# Patient Record
Sex: Female | Born: 1935 | Race: White | Hispanic: No | Smoking: Never smoker
Health system: Southern US, Community
[De-identification: ages and names within clinical notes are randomized; demographics above are authoritative.]

## PROBLEM LIST (undated history)

## (undated) DIAGNOSIS — I10 Essential (primary) hypertension: Secondary | ICD-10-CM

## (undated) DIAGNOSIS — I2510 Atherosclerotic heart disease of native coronary artery without angina pectoris: Secondary | ICD-10-CM

## (undated) DIAGNOSIS — D649 Anemia, unspecified: Secondary | ICD-10-CM

## (undated) DIAGNOSIS — Q796 Ehlers-Danlos syndrome, unspecified: Secondary | ICD-10-CM

## (undated) DIAGNOSIS — I509 Heart failure, unspecified: Secondary | ICD-10-CM

## (undated) DIAGNOSIS — Q359 Cleft palate, unspecified: Secondary | ICD-10-CM

## (undated) DIAGNOSIS — K219 Gastro-esophageal reflux disease without esophagitis: Secondary | ICD-10-CM

## (undated) DIAGNOSIS — I350 Nonrheumatic aortic (valve) stenosis: Secondary | ICD-10-CM

## (undated) HISTORY — DX: Nonrheumatic aortic (valve) stenosis: I35.0

## (undated) HISTORY — PX: CATARACT EXTRACTION: SUR2

## (undated) HISTORY — PX: CORONARY ARTERY BYPASS GRAFT: SHX141

## (undated) HISTORY — PX: TONSILLECTOMY: SUR1361

## (undated) HISTORY — PX: CARDIAC SURGERY: SHX584

## (undated) HISTORY — PX: CYSTOSTOMY: SHX155

## (undated) HISTORY — PX: ABDOMINAL HYSTERECTOMY: SHX81

## (undated) HISTORY — DX: Atherosclerotic heart disease of native coronary artery without angina pectoris: I25.10

## (undated) HISTORY — PX: HERNIA REPAIR: SHX51

---

## 2001-11-18 ENCOUNTER — Other Ambulatory Visit: Admission: RE | Admit: 2001-11-18 | Discharge: 2001-11-18 | Payer: Self-pay | Admitting: Internal Medicine

## 2002-03-16 ENCOUNTER — Observation Stay (HOSPITAL_COMMUNITY): Admission: RE | Admit: 2002-03-16 | Discharge: 2002-03-17 | Payer: Self-pay | Admitting: Internal Medicine

## 2005-07-16 ENCOUNTER — Ambulatory Visit (HOSPITAL_COMMUNITY): Admission: RE | Admit: 2005-07-16 | Discharge: 2005-07-16 | Payer: Self-pay | Admitting: General Surgery

## 2006-05-07 ENCOUNTER — Ambulatory Visit (HOSPITAL_COMMUNITY): Payer: Self-pay | Admitting: Oncology

## 2006-05-07 ENCOUNTER — Encounter (HOSPITAL_COMMUNITY): Admission: RE | Admit: 2006-05-07 | Discharge: 2006-06-06 | Payer: Self-pay | Admitting: Oncology

## 2006-05-07 ENCOUNTER — Encounter: Admission: RE | Admit: 2006-05-07 | Discharge: 2006-05-07 | Payer: Self-pay | Admitting: Oncology

## 2006-06-21 ENCOUNTER — Encounter (HOSPITAL_COMMUNITY): Admission: RE | Admit: 2006-06-21 | Discharge: 2006-07-21 | Payer: Self-pay | Admitting: Oncology

## 2006-06-21 ENCOUNTER — Encounter: Admission: RE | Admit: 2006-06-21 | Discharge: 2006-06-21 | Payer: Self-pay | Admitting: Oncology

## 2006-07-10 ENCOUNTER — Ambulatory Visit: Payer: Self-pay | Admitting: Internal Medicine

## 2006-07-26 ENCOUNTER — Ambulatory Visit (HOSPITAL_COMMUNITY): Admission: RE | Admit: 2006-07-26 | Discharge: 2006-07-26 | Payer: Self-pay | Admitting: Internal Medicine

## 2006-07-26 ENCOUNTER — Ambulatory Visit: Payer: Self-pay | Admitting: Internal Medicine

## 2006-08-20 ENCOUNTER — Ambulatory Visit (HOSPITAL_COMMUNITY): Admission: RE | Admit: 2006-08-20 | Discharge: 2006-08-20 | Payer: Self-pay | Admitting: Otolaryngology

## 2006-08-23 ENCOUNTER — Ambulatory Visit (HOSPITAL_COMMUNITY): Payer: Self-pay | Admitting: Oncology

## 2006-08-27 ENCOUNTER — Encounter: Admission: RE | Admit: 2006-08-27 | Discharge: 2006-08-27 | Payer: Self-pay | Admitting: Oncology

## 2006-08-27 ENCOUNTER — Encounter (HOSPITAL_COMMUNITY): Admission: RE | Admit: 2006-08-27 | Discharge: 2006-09-26 | Payer: Self-pay | Admitting: Oncology

## 2006-11-18 ENCOUNTER — Ambulatory Visit (HOSPITAL_COMMUNITY): Payer: Self-pay | Admitting: Oncology

## 2006-11-18 ENCOUNTER — Encounter (HOSPITAL_COMMUNITY): Admission: RE | Admit: 2006-11-18 | Discharge: 2006-12-18 | Payer: Self-pay | Admitting: Oncology

## 2006-11-18 ENCOUNTER — Encounter: Admission: RE | Admit: 2006-11-18 | Discharge: 2006-11-18 | Payer: Self-pay | Admitting: Oncology

## 2007-05-28 ENCOUNTER — Encounter (HOSPITAL_COMMUNITY): Admission: RE | Admit: 2007-05-28 | Discharge: 2007-06-27 | Payer: Self-pay | Admitting: Oncology

## 2007-05-28 ENCOUNTER — Ambulatory Visit (HOSPITAL_COMMUNITY): Payer: Self-pay | Admitting: Oncology

## 2007-11-24 ENCOUNTER — Ambulatory Visit (HOSPITAL_COMMUNITY): Payer: Self-pay | Admitting: Oncology

## 2007-11-24 ENCOUNTER — Encounter (HOSPITAL_COMMUNITY): Admission: RE | Admit: 2007-11-24 | Discharge: 2007-12-24 | Payer: Self-pay | Admitting: Oncology

## 2008-01-19 ENCOUNTER — Ambulatory Visit (HOSPITAL_COMMUNITY): Payer: Self-pay | Admitting: Oncology

## 2008-01-19 ENCOUNTER — Encounter (HOSPITAL_COMMUNITY): Admission: RE | Admit: 2008-01-19 | Discharge: 2008-02-18 | Payer: Self-pay | Admitting: Oncology

## 2008-05-12 ENCOUNTER — Ambulatory Visit (HOSPITAL_COMMUNITY): Payer: Self-pay | Admitting: Oncology

## 2008-05-12 ENCOUNTER — Encounter (HOSPITAL_COMMUNITY): Admission: RE | Admit: 2008-05-12 | Discharge: 2008-06-11 | Payer: Self-pay | Admitting: Oncology

## 2008-08-16 ENCOUNTER — Ambulatory Visit (HOSPITAL_COMMUNITY): Payer: Self-pay | Admitting: Oncology

## 2008-08-16 ENCOUNTER — Encounter (HOSPITAL_COMMUNITY): Admission: RE | Admit: 2008-08-16 | Discharge: 2008-09-15 | Payer: Self-pay | Admitting: Oncology

## 2008-12-01 ENCOUNTER — Encounter (HOSPITAL_COMMUNITY): Admission: RE | Admit: 2008-12-01 | Discharge: 2008-12-31 | Payer: Self-pay | Admitting: Oncology

## 2008-12-01 ENCOUNTER — Ambulatory Visit (HOSPITAL_COMMUNITY): Payer: Self-pay | Admitting: Oncology

## 2009-02-08 ENCOUNTER — Ambulatory Visit (HOSPITAL_COMMUNITY): Payer: Self-pay | Admitting: Oncology

## 2009-02-08 ENCOUNTER — Encounter (HOSPITAL_COMMUNITY): Admission: RE | Admit: 2009-02-08 | Discharge: 2009-03-10 | Payer: Self-pay | Admitting: Oncology

## 2009-08-03 ENCOUNTER — Ambulatory Visit (HOSPITAL_COMMUNITY): Admission: RE | Admit: 2009-08-03 | Discharge: 2009-08-03 | Payer: Self-pay | Admitting: Family Medicine

## 2009-08-12 ENCOUNTER — Ambulatory Visit (HOSPITAL_COMMUNITY): Payer: Self-pay | Admitting: Oncology

## 2009-08-12 ENCOUNTER — Encounter (HOSPITAL_COMMUNITY): Admission: RE | Admit: 2009-08-12 | Discharge: 2009-09-11 | Payer: Self-pay | Admitting: Oncology

## 2010-02-03 ENCOUNTER — Encounter (HOSPITAL_COMMUNITY): Admission: RE | Admit: 2010-02-03 | Discharge: 2010-03-05 | Payer: Self-pay | Admitting: Oncology

## 2010-02-03 ENCOUNTER — Ambulatory Visit (HOSPITAL_COMMUNITY): Payer: Self-pay | Admitting: Oncology

## 2010-07-31 ENCOUNTER — Ambulatory Visit (HOSPITAL_COMMUNITY): Payer: Self-pay | Admitting: Oncology

## 2010-07-31 ENCOUNTER — Encounter (HOSPITAL_COMMUNITY): Admission: RE | Admit: 2010-07-31 | Discharge: 2010-08-30 | Payer: Self-pay | Admitting: Oncology

## 2010-10-16 ENCOUNTER — Encounter (HOSPITAL_COMMUNITY)
Admission: RE | Admit: 2010-10-16 | Discharge: 2010-11-15 | Payer: Self-pay | Source: Home / Self Care | Admitting: Oncology

## 2010-10-16 ENCOUNTER — Ambulatory Visit (HOSPITAL_COMMUNITY): Payer: Self-pay | Admitting: Oncology

## 2010-12-11 ENCOUNTER — Ambulatory Visit (HOSPITAL_COMMUNITY): Payer: Self-pay | Admitting: Oncology

## 2010-12-11 ENCOUNTER — Encounter (HOSPITAL_COMMUNITY)
Admission: RE | Admit: 2010-12-11 | Discharge: 2011-01-10 | Payer: Self-pay | Source: Home / Self Care | Attending: Oncology | Admitting: Oncology

## 2011-03-12 LAB — CBC
Hemoglobin: 11.6 g/dL — ABNORMAL LOW (ref 12.0–15.0)
RBC: 3.85 MIL/uL — ABNORMAL LOW (ref 3.87–5.11)
WBC: 5.7 10*3/uL (ref 4.0–10.5)

## 2011-03-12 LAB — FERRITIN: Ferritin: 326 ng/mL — ABNORMAL HIGH (ref 10–291)

## 2011-03-13 ENCOUNTER — Other Ambulatory Visit (HOSPITAL_COMMUNITY): Payer: Medicare Other

## 2011-03-14 LAB — CBC
HCT: 35.2 % — ABNORMAL LOW (ref 36.0–46.0)
Hemoglobin: 12 g/dL (ref 12.0–15.0)
WBC: 6.3 10*3/uL (ref 4.0–10.5)

## 2011-03-16 ENCOUNTER — Other Ambulatory Visit (HOSPITAL_COMMUNITY): Payer: Medicare Other

## 2011-03-16 ENCOUNTER — Encounter (HOSPITAL_COMMUNITY): Payer: Medicare Other | Attending: Oncology

## 2011-03-16 DIAGNOSIS — D509 Iron deficiency anemia, unspecified: Secondary | ICD-10-CM | POA: Insufficient documentation

## 2011-03-16 DIAGNOSIS — D649 Anemia, unspecified: Secondary | ICD-10-CM

## 2011-03-16 LAB — COMPREHENSIVE METABOLIC PANEL
ALT: 13 U/L (ref 0–35)
Albumin: 3.8 g/dL (ref 3.5–5.2)
Alkaline Phosphatase: 66 U/L (ref 39–117)
BUN: 28 mg/dL — ABNORMAL HIGH (ref 6–23)
Chloride: 102 mEq/L (ref 96–112)
Glucose, Bld: 107 mg/dL — ABNORMAL HIGH (ref 70–99)
Potassium: 4 mEq/L (ref 3.5–5.1)
Total Bilirubin: 0.5 mg/dL (ref 0.3–1.2)

## 2011-03-16 LAB — CBC
HCT: 34.2 % — ABNORMAL LOW (ref 36.0–46.0)
MCV: 86.7 fL (ref 78.0–100.0)
Platelets: 278 10*3/uL (ref 150–400)
RBC: 3.94 MIL/uL (ref 3.87–5.11)
WBC: 5.5 10*3/uL (ref 4.0–10.5)

## 2011-03-16 LAB — DIFFERENTIAL
Basophils Absolute: 0 10*3/uL (ref 0.0–0.1)
Basophils Relative: 1 % (ref 0–1)
Eosinophils Absolute: 0.2 10*3/uL (ref 0.0–0.7)
Monocytes Absolute: 0.3 10*3/uL (ref 0.1–1.0)
Neutro Abs: 3.8 10*3/uL (ref 1.7–7.7)

## 2011-03-19 ENCOUNTER — Ambulatory Visit (HOSPITAL_COMMUNITY): Payer: Medicare Other | Admitting: Oncology

## 2011-03-19 DIAGNOSIS — D509 Iron deficiency anemia, unspecified: Secondary | ICD-10-CM

## 2011-03-21 LAB — CBC
MCHC: 33.3 g/dL (ref 30.0–36.0)
Platelets: 346 10*3/uL (ref 150–400)
RBC: 4.36 MIL/uL (ref 3.87–5.11)
RDW: 12.9 % (ref 11.5–15.5)

## 2011-03-21 LAB — FERRITIN: Ferritin: 28 ng/mL (ref 10–291)

## 2011-04-07 LAB — FERRITIN: Ferritin: 53 ng/mL (ref 10–291)

## 2011-04-07 LAB — CBC
HCT: 34.9 % — ABNORMAL LOW (ref 36.0–46.0)
Platelets: 243 10*3/uL (ref 150–400)
RDW: 13 % (ref 11.5–15.5)
WBC: 4.3 10*3/uL (ref 4.0–10.5)

## 2011-04-17 LAB — CBC
Hemoglobin: 11.8 g/dL — ABNORMAL LOW (ref 12.0–15.0)
RBC: 3.96 MIL/uL (ref 3.87–5.11)
WBC: 5.7 10*3/uL (ref 4.0–10.5)

## 2011-04-17 LAB — FERRITIN: Ferritin: 160 ng/mL (ref 10–291)

## 2011-05-18 NOTE — Procedures (Signed)
[facility]  Patient:    [name], [name] Visit Number: [id number] MRN: [id number]          Service Type: OBV Location: 3A A336 01 Attending Physician:  Newt Minion Dictated by:   Sinda Du, M.D. Proc. Date: 03/13/02 Admit Date:  03/16/2002 Discharge Date: 03/17/2002                            EKG Interpretations  This EKG is confirmed. Dictated by:   Sinda Du, M.D. Attending Physician:  Newt Minion DD:  04/01/02 TD:  04/02/02 Job: [id number]

## 2011-05-18 NOTE — H&P (Signed)
[facility]  Patient:    [name], [name] Visit Number: [id number] MRN: [id number]          Service Type: Attending:  Newt Minion, M.D. Dictated by:   Newt Minion, M.D. Adm. Date:  03/16/02                           History and Physical  REASON FOR ADMISSION:  This 77 year old white female is admitted to this hospital with a recurrent right inguinal hernia.  HISTORY OF PRESENT ILLNESS:  The patient had a repair of a right inguinal hernia in September of 2000.  Recently she began to complain of pain and discomfort in the right groin with radiation down the right leg.  Exam today in the office did reveal a definite pulsation noted over the area of the external ring and also near the right femoral artery.  However, it was difficult to differentiate whether this was a recurrent right inguinal hernia or recurrent femoral hernia.  She is admitted now for repair.  PAST MEDICAL HISTORY: 1. She had a right partial mastectomy in September of 2000. 2. Left partial mastectomy in 1976. 3. The hernia repair as mentioned. 4. Vaginal hysterectomy and anterior repair in January 1988. 5. Mild hypertension. 6. No serious illnesses.  ALLERGIES:  No history of any food or drug allergy.  CURRENT MEDICATIONS: 1. Altace 10 mg daily. 2. HCTZ 25 mg daily for mild hypertension.  PHYSICAL EXAMINATION:  GENERAL:  Healthy well-developed, well-nourished 77 year old white female in no acute distress.  VITAL SIGNS:  Blood pressure 126/84, pulse 80, respirations 20.  HEENT:  Normal.  NECK:  Supple, thyroid not enlarged, no palpable cervical adenopathy.  CARDIOVASCULAR:  Regular sinus rhythm, no thrills, no murmurs.  RESPIRATORY:  Chest clear to auscultation and percussion.  BREASTS:  Of modest size, nipples are symmetrical, there is a well-healed operative scar in the upper outer quadrant of both breasts.  The tissue was irregular throughout both glands but no  discreet masses are noted. Examination of both axilla is within normal limits.  ABDOMEN:  Soft, has normal peristalsis, no hepatosplenomegaly.  There is no operative scars other than the barely visible incision in the right groin.  PELVIC:  There is a marital introitus.  No pathology of Bartholins and Skenes glands.  Vaginal cuff is well healed.  A recent Pap smear was normal. Examination of both adnexa was normal.  Rectovaginal examination was normal.  ADMITTING DIAGNOSIS:  Recurrent right inguinal/femoral hernia.  DISPOSITION:  The patient is admitted for repair of this hernia.  The surgery, risks and complications have been discussed thoroughly with the patient and she agrees to the surgery.  She has been scheduled for March 16, 2002. Dictated by:   Newt Minion, M.D. Attending:  Newt Minion, M.D. DD:  03/13/02 TD:  03/14/02 Job: [id number]

## 2011-05-18 NOTE — H&P (Signed)
NAME:  [name], [name]             ACCOUNT NO.:  [id number]   MEDICAL RECORD NO.:  [id number]           PATIENT TYPE:  AMB   LOCATION:                                FACILITY:  [facility]   PHYSICIAN:  Jamesetta So, M.D.  DATE OF BIRTH:  03/23/1922   DATE OF ADMISSION:  DATE OF DISCHARGE:  LH                                HISTORY & PHYSICAL   CHIEF COMPLAINT:  hematochezia   HISTORY OF PRESENT ILLNESS:  The patient is an 77 year old white female who  is referred for endoscopic evaluation. She needs a colonoscopy for  hematochezia. She does have a history of diverticulosis. She last had a  colonoscopy in 2000 by gastroenterology. She was noted to have blood on her  stool on a recent examination. No abdominal pain, weight loss, nausea,  vomiting, diarrhea, or melena have been noted.   PAST MEDICAL HISTORY:  Hypertension.   PAST SURGICAL HISTORY:  1.  Hysterectomy.  2.  Herniorrhaphy.  3.  Breast biopsy.   CURRENT MEDICATIONS:  Altace, hydrochlorothiazide.   ALLERGIES:  No known drug allergies.   REVIEW OF SYSTEMS:  Noncontributory.   PHYSICAL EXAMINATION:  GENERAL:  The patient is a well-developed, well-  nourished, white female in no acute distress. She is afebrile and vital  signs are stable.  LUNGS:  Clear to auscultation with equal breath sounds bilaterally.  HEART:  Reveals regular rate and rhythm without S3, S4, or murmurs.  ABDOMEN:  Soft, nontender, nondistended. No hepatosplenomegaly or masses are  noted.  RECTAL:  Deferred to the procedure.   IMPRESSION:  Hematochezia.   PLAN:  The patient is scheduled for colonoscopy on June 26, 2005. The risks  and benefits of the procedure including bleeding and perforation were fully  explained to the patient who gave informed consent.       MAJ/MEDQ  D:  05/29/2005  T:  05/29/2005  Job:  [id number]   cc:   [facility]  Fax: Hornick Marnette Burgess, M.D.  [address]  [place]  [place] [address]  Fax:  [phone]

## 2011-05-18 NOTE — Op Note (Signed)
NAME:  [name], [name]             ACCOUNT NO.:  [id number]   MEDICAL RECORD NO.:  [id number]          PATIENT TYPE:  AMB   LOCATION:  DAY                           FACILITY:  [facility]   PHYSICIAN:  Hildred Laser, M.D.    DATE OF BIRTH:  [birth year]   DATE OF PROCEDURE:  07/26/2006  DATE OF DISCHARGE:                                 OPERATIVE REPORT   PROCEDURE:  Esophagogastroduodenoscopy.   INDICATIONS:  [name] is an 77 year old Caucasian female who was  recently found to have iron-deficiency anemia by Dr. Tressie Stalker.  Her  hemoglobin dropped to 8.5.  She has been on Repliva and her hemoglobin 1  month ago was 10.5.  She had Hemoccults and 1 out of 6 was positive.  She is  on low-dose ASA.  Her last colonoscopy was a year ago.  We therefore decided  to examine her upper GI tract to further evaluate her iron-deficiency  anemia.  The Procedure and risks were reviewed with the patient, informed  consent was obtained.   MEDICATIONS FOR CONSCIOUS SEDATION:  Benzocaine spray for pharyngeal topical  anesthesia, Demerol 25 mg IV, Versed 3 mg IV.   FINDINGS:  Procedure performed in endoscopy suite.  The patient's vital  signs and O2 saturation were monitored during procedure and remained stable.  The patient was placed in the left lateral recumbent position and the  Olympus video scope was passed via oropharynx without any difficulty into  esophagus.   Esophagus:  Mucosa of the esophagus was normal.  She had a single linear  erosion in distal esophagus extending to GE junction.  It was about 6-7 mm  long.  GE junction was wavy.  A small sliding hiatal hernia.  Hiatus was at  37 cm.   Stomach:  It was empty and distended very well with insufflation.  The folds  of the proximal stomach were normal.  Examination of the mucosa revealed a 5  mm submucosal lesion at gastric body along the anterior wall consistent with  a small leiomyoma.  This was left alone.  The rest of the mucosa was  normal.  Pyloric channel was patent.  Angularis, fundus and cardia examined by  retroflexing the scope and were normal.   Duodenum:  Bulbar mucosa was normal.  The scope was passed to the second  part of duodenum, where mucosa and folds were normal.  Endoscope was  withdrawn.  The patient tolerated the procedure well.   FINAL DIAGNOSES:  1.  Erosive of reflux esophagitis.  2.  A small sliding hiatal hernia.  3.  A 5 mL submucosal lesion at gastric body, most likely due to a small      leiomyoma.   No lesion found to account for the patient's iron-deficiency anemia,  although heme-positive stools could be due to her esophageal erosion in the  setting of ASA.   RECOMMENDATIONS:  1.  Antireflux measures.  2.  Omeprazole 20 mg p.o. q.a.m.  If this not covered by insurance, she can      just buy OTC.  3.  we will check her H&H today.  4.  Hold Repliva for 1 week.  She will return for a small bowel capsule      study in 7-10 days.      Hildred Laser, M.D.  Electronically Signed     NR/MEDQ  D:  07/26/2006  T:  07/26/2006  Job:  [id number]   cc:   Gaston Islam. Tressie Stalker, MD  Fax: [phone]   Leonides Grills, M.D.  Fax: [phone]

## 2011-05-18 NOTE — Op Note (Signed)
[facility]  Patient:    [name], [name] Visit Number: [id number] MRN: [id number]          Service Type: OBV Location: 3A A336 01 Attending Physician:  Newt Minion Dictated by:   Newt Minion, M.D. Proc. Date: 03/16/02 Admit Date:  03/16/2002 Discharge Date: 03/17/2002                             Operative Report  PREOPERATIVE DIAGNOSIS:   Recurrent inguinal/femoral hernia.  POSTOPERATIVE DIAGNOSIS:  Recurrent inguinal/femoral hernia.  OPERATION/PROCEDURE:  Repair of femoral hernia with insertion of mesh.  SURGEON:  Newt Minion, M.D.  COMPLICATIONS:  None.  DESCRIPTION OF PROCEDURE:  Under adequate spinal anesthesia, the patient was prepped and draped in the usual manner and incision was made through a previously made scar in the right groin.  This was carried down through the subcutaneous tissue to the surface of the aponeurosis of the external oblique. The aponeurosis had previously been imbricated and it was necessary to cut all of the sutures and develop the flaps all over again.  Dissection was carried down to the level of Coopers ligament.  The femoral artery and vein were easily identified.  Transversalis fascia was reapproximated with interrupted #1 Bralon.  The conjoined tendon was approximated to Coopers ligament with interrupted #1 Bralon.  A piece of mesh was then placed on top of this conjoined tendon and the sutures were passed through the Coopers ligament in the mesh into the medial flap of the aponeurosis and the aponeurosis was then imbricated to the Pouparts ligament and sutures were passed through Coopers ligament.  This gave [place] a really strong repair.  The subcutaneous tissues were then closed with continuous 3-0 Vicryl.  The skin was closed with a subcuticular suture of 3-0 Vicryl.  It should be mentioned the dissection was also carried below the level of the inguinal ligament and any defect at the outlet of the  artery and vein was closed with interrupted 3-0 Bralon sutures.  The skin was closed with subcuticular suture of 3-0 Vicryl.  Steri-Strips, Telfa, Op-Site dressing applied.  The patient tolerated the procedure nicely and left in good condition. Dictated by:   Newt Minion, M.D. Attending Physician:  Newt Minion DD:  03/16/02 TD:  03/18/02 Job: [id number]

## 2011-05-18 NOTE — Consult Note (Signed)
NAME:  [name], [name]             ACCOUNT NO.:  [id number]   MEDICAL RECORD NO.:  [id number]          PATIENT TYPE:   LOCATION:                                 FACILITY:   PHYSICIAN:  Hildred Laser, M.D.    DATE OF BIRTH:  Jan 25, 1922   DATE OF CONSULTATION:  DATE OF DISCHARGE:                                   CONSULTATION   REQUESTING PHYSICIAN:  Dr. Tressie Stalker   PRIMARY CARE PHYSICIAN:  Dr. Orson Ape   REASON FOR CONSULTATION:  Iron deficiency anemia, hemoccult positive stool.   HISTORY OF PRESENT ILLNESS:  [name] is an 77 year old Caucasian  female referred through the courtesy of Dr. Tressie Stalker.  She was diagnosed  with anemia back in April 2007.  At that time she had a hemoglobin in the 8  range.  She was found to have iron deficiency anemia.  She had normal 123456,  folic acid, and a low serum iron.  She was also noted to have an elevated  BUN and creatinine but she was on hydrochlorothiazide at that time.  She has  a history of colonoscopy a year ago by Dr. Arnoldo Morale; reportedly it was  normal.  Prior to this she had had another normal colonoscopy May 30, 1999,  by Dr. Laural Golden.  She was found to have 1/6 stools hemoccult positive through  Dr. Jaclyn Prime office.  She has a history of chronic constipation but has  responded well to over-the-counter stool softeners.  She takes an occasional  senna laxative.  She generally has a bowel movement at least every 3 days.  Denies any rectal bleeding or melena.  She denies any nausea, vomiting,  heartburn, indigestion, anorexia, or early satiety.  Denies any dysphagia or  odynophagia.  She has been on aspirin 81 mg daily for about 3 years.  She  states she has been on Repliva once daily for the last 3 months.  She denies  any previous history of anemia.  Last hemoglobin through Dr. Jaclyn Prime  office was 10.5 around June 25, 2006.   PAST MEDICAL HISTORY:  1.  Hypercholesterolemia.  2.  Chronic constipation.  3.  Osteoporosis.  4.  Cleft palate.  5.  Hypertension.  6.  Anemia.  See HPI.  7.  Colonoscopy in 2000 by Dr. Laural Golden, normal.  Colonoscopy July 2006 by Dr.      Arnoldo Morale reportedly normal (will request report).  8.  Complete hysterectomy.  9.  Two inguinal herniorrhaphies.  10. She has had surgery on her cleft palate.   CURRENT MEDICATIONS:  1.  Actonel 35 mg weekly.  2.  Repliva once daily.  3.  Caltrate one b.i.d.  4.  Hydrochlorothiazide 25 mg Monday, Wednesday, Friday.  5.  Altace 10 mg daily.  6.  Senna laxative p.r.n.  7.  Over-the-counter stool softeners at bedtime p.r.n.  8.  Aspirin 81 mg daily.  9.  Tylenol once daily p.r.n.   ALLERGIES:  1.  MORPHINE.  2.  SULFA.  3.  PENICILLIN.   FAMILY HISTORY:  There is no known family history of colorectal carcinoma,  liver or chronic GI  problems.  Mother deceased age 75 secondary to CVA.  Father deceased at age 41 with history of coronary artery disease.   SOCIAL HISTORY:  Mrs. Haist is a widow.  She has three grown healthy  children.  She is retired from [employer].  She denies any tobacco, alcohol,  or drug use.   REVIEW OF SYSTEMS:  CONSTITUTIONAL:  Weight is stable.  Denies any fever or  chills.  CARDIOVASCULAR:  Denies any chest pain or palpitations.  PULMONARY:  Denies any shortness of breath, dyspnea, cough, hemoptysis.  GI:  See HPI.  PSYCHOSOCIAL:  She reports she noticed she started feeling more fatigued.  Her husband was hospitalized for 3 months and died in 2006/02/22.  HEMATOLOGICAL:  Denies any history of anemia or blood dyscrasias.  Denies  any history of epistaxis or easy bruising.   PHYSICAL EXAMINATION:  VITAL SIGNS:  Weight 128 pounds, height 64 inches.  Temperature 97.8, blood pressure 130/78, pulse 84.  GENERAL:  [name] is an 77 year old Caucasian female who is alert,  oriented, pleasant, and cooperative, no acute distress.  HEENT:  Sclerae clear, nonicteric.  Conjunctivae pink.  Oropharynx pink and  moist  without any lesions.  NECK:  Supple without mass or thyromegaly.  CHEST:  Heart regular rate and rhythm, normal S1, S2, without murmurs, rubs,  clicks, or gallops.  LUNGS:  Clear to auscultation bilaterally.  ABDOMEN:  Positive bowel sounds x4.  No bruits auscultated.  Soft,  nontender, nondistended, without palpable mass or hepatosplenomegaly.  No  rebound tenderness or guarding.  EXTREMITIES:  Without clubbing or edema bilaterally.  RECTAL:  Deferred.  SKIN:  Pink, warm and dry without any rash or jaundice.   ASSESSMENT:  [name] is an 77 year old Caucasian female with a 32-month  history of iron deficiency anemia, initial hemoglobin in April was as low as  8.5.  She was found to have iron deficiency anemia.  Most recent hemoglobin  was up to 10.5 on Repliva.  She is now found to have 1/6 stool cards  hemoccult positive.  Reportedly she has had a normal colonoscopy last year  by Dr. Arnoldo Morale.  She is on aspirin 81 mg daily.  She denies any GI  complaints at this time other than history of chronic constipation which is  well controlled at this time.  Interestingly, she was also found to have an  elevated BUN and creatinine, felt possibly related to her use of diuretic  therapy.   I have discussed the above with Dr. Laural Golden.  At this point we will request  colonoscopy report from Dr. Arnoldo Morale' office and will proceed with EGD for  further evaluation to rule out occult GI bleed, peptic ulcer disease, etc.  Ultimately, she may need small-bowel Given study as well.   PLAN:  1.  Will schedule EGD with Dr. Laural Golden in the near future.  I have discussed      the procedure including risks and benefits which include but are not      limited to bleeding, infection, perforation, drug reaction.  She agrees      to proceed and consent will be obtained.  She is to hold her aspirin 3      days prior to procedure.  2.  Continue Repliva as directed.  3.  Further recommendations pending  procedure.   We would like to thank Dr. Tressie Stalker for allowing [place] to participate in the  care of Mrs. Kamrowski.      Brennan Bailey  Geanie Berlin, M.D.  Electronically Signed    KC/MEDQ  D:  07/10/2006  T:  07/10/2006  Job:  [id number]   cc:   Leonides Grills, M.D.  Fax: [phone]   Gaston Islam. Tressie Stalker, MD  Fax: [phone]

## 2011-06-07 ENCOUNTER — Other Ambulatory Visit (HOSPITAL_COMMUNITY): Payer: Medicare Other

## 2011-06-11 ENCOUNTER — Ambulatory Visit (HOSPITAL_COMMUNITY): Payer: Medicare Other | Admitting: Oncology

## 2011-06-18 ENCOUNTER — Ambulatory Visit (HOSPITAL_COMMUNITY): Payer: Medicare Other | Admitting: Oncology

## 2011-07-28 ENCOUNTER — Inpatient Hospital Stay (HOSPITAL_COMMUNITY): Payer: Medicare Other

## 2011-07-28 ENCOUNTER — Encounter: Payer: Self-pay | Admitting: Emergency Medicine

## 2011-07-28 ENCOUNTER — Inpatient Hospital Stay (HOSPITAL_COMMUNITY)
Admission: AD | Admit: 2011-07-28 | Discharge: 2011-08-16 | DRG: 216 | Disposition: A | Discharge status: Home Health | Payer: Medicare Other | Source: Other Acute Inpatient Hospital | Attending: Surgery | Admitting: Surgery

## 2011-07-28 ENCOUNTER — Other Ambulatory Visit: Payer: Self-pay

## 2011-07-28 ENCOUNTER — Emergency Department (HOSPITAL_COMMUNITY): Payer: Medicare Other

## 2011-07-28 ENCOUNTER — Emergency Department (HOSPITAL_COMMUNITY)
Admission: EM | Admit: 2011-07-28 | Discharge: 2011-07-28 | Disposition: A | Discharge status: Hospice | Payer: Medicare Other | Source: Home / Self Care | Attending: Emergency Medicine | Admitting: Emergency Medicine

## 2011-07-28 DIAGNOSIS — I359 Nonrheumatic aortic valve disorder, unspecified: Principal | ICD-10-CM | POA: Diagnosis present

## 2011-07-28 DIAGNOSIS — Z7982 Long term (current) use of aspirin: Secondary | ICD-10-CM

## 2011-07-28 DIAGNOSIS — I509 Heart failure, unspecified: Secondary | ICD-10-CM

## 2011-07-28 DIAGNOSIS — N179 Acute kidney failure, unspecified: Secondary | ICD-10-CM | POA: Diagnosis not present

## 2011-07-28 DIAGNOSIS — D62 Acute posthemorrhagic anemia: Secondary | ICD-10-CM | POA: Diagnosis not present

## 2011-07-28 DIAGNOSIS — Z79899 Other long term (current) drug therapy: Secondary | ICD-10-CM | POA: Insufficient documentation

## 2011-07-28 DIAGNOSIS — R0602 Shortness of breath: Secondary | ICD-10-CM | POA: Insufficient documentation

## 2011-07-28 DIAGNOSIS — I498 Other specified cardiac arrhythmias: Secondary | ICD-10-CM | POA: Diagnosis present

## 2011-07-28 DIAGNOSIS — I129 Hypertensive chronic kidney disease with stage 1 through stage 4 chronic kidney disease, or unspecified chronic kidney disease: Secondary | ICD-10-CM | POA: Diagnosis present

## 2011-07-28 DIAGNOSIS — N183 Chronic kidney disease, stage 3 unspecified: Secondary | ICD-10-CM | POA: Diagnosis present

## 2011-07-28 DIAGNOSIS — I4891 Unspecified atrial fibrillation: Secondary | ICD-10-CM | POA: Diagnosis not present

## 2011-07-28 DIAGNOSIS — D696 Thrombocytopenia, unspecified: Secondary | ICD-10-CM | POA: Diagnosis not present

## 2011-07-28 DIAGNOSIS — I10 Essential (primary) hypertension: Secondary | ICD-10-CM | POA: Insufficient documentation

## 2011-07-28 DIAGNOSIS — T50995A Adverse effect of other drugs, medicaments and biological substances, initial encounter: Secondary | ICD-10-CM | POA: Diagnosis not present

## 2011-07-28 DIAGNOSIS — H9190 Unspecified hearing loss, unspecified ear: Secondary | ICD-10-CM | POA: Insufficient documentation

## 2011-07-28 DIAGNOSIS — N390 Urinary tract infection, site not specified: Secondary | ICD-10-CM | POA: Diagnosis not present

## 2011-07-28 DIAGNOSIS — A498 Other bacterial infections of unspecified site: Secondary | ICD-10-CM | POA: Diagnosis not present

## 2011-07-28 DIAGNOSIS — I2510 Atherosclerotic heart disease of native coronary artery without angina pectoris: Secondary | ICD-10-CM | POA: Diagnosis present

## 2011-07-28 DIAGNOSIS — M810 Age-related osteoporosis without current pathological fracture: Secondary | ICD-10-CM | POA: Diagnosis present

## 2011-07-28 DIAGNOSIS — I2789 Other specified pulmonary heart diseases: Secondary | ICD-10-CM | POA: Diagnosis present

## 2011-07-28 DIAGNOSIS — K219 Gastro-esophageal reflux disease without esophagitis: Secondary | ICD-10-CM | POA: Insufficient documentation

## 2011-07-28 DIAGNOSIS — I5023 Acute on chronic systolic (congestive) heart failure: Secondary | ICD-10-CM | POA: Diagnosis present

## 2011-07-28 DIAGNOSIS — R059 Cough, unspecified: Secondary | ICD-10-CM | POA: Insufficient documentation

## 2011-07-28 DIAGNOSIS — E785 Hyperlipidemia, unspecified: Secondary | ICD-10-CM | POA: Diagnosis present

## 2011-07-28 DIAGNOSIS — R05 Cough: Secondary | ICD-10-CM | POA: Insufficient documentation

## 2011-07-28 DIAGNOSIS — I428 Other cardiomyopathies: Secondary | ICD-10-CM | POA: Diagnosis present

## 2011-07-28 DIAGNOSIS — R0789 Other chest pain: Secondary | ICD-10-CM | POA: Insufficient documentation

## 2011-07-28 DIAGNOSIS — E876 Hypokalemia: Secondary | ICD-10-CM | POA: Diagnosis not present

## 2011-07-28 HISTORY — DX: Essential (primary) hypertension: I10

## 2011-07-28 HISTORY — DX: Anemia, unspecified: D64.9

## 2011-07-28 HISTORY — DX: Gastro-esophageal reflux disease without esophagitis: K21.9

## 2011-07-28 LAB — BASIC METABOLIC PANEL
CO2: 17 mEq/L — ABNORMAL LOW (ref 19–32)
Calcium: 9.9 mg/dL (ref 8.4–10.5)
Chloride: 102 mEq/L (ref 96–112)
Creatinine, Ser: 1.6 mg/dL — ABNORMAL HIGH (ref 0.50–1.10)
Glucose, Bld: 231 mg/dL — ABNORMAL HIGH (ref 70–99)
Sodium: 139 mEq/L (ref 135–145)

## 2011-07-28 LAB — CBC
HCT: 35.9 % — ABNORMAL LOW (ref 36.0–46.0)
HCT: 31.7 % — ABNORMAL LOW (ref 36.0–46.0)
Hemoglobin: 10.7 g/dL — ABNORMAL LOW (ref 12.0–15.0)
MCHC: 33.8 g/dL (ref 30.0–36.0)
MCV: 91.1 fL (ref 78.0–100.0)
MCV: 88.8 fL (ref 78.0–100.0)
RBC: 3.94 MIL/uL (ref 3.87–5.11)
WBC: 11.8 10*3/uL — ABNORMAL HIGH (ref 4.0–10.5)

## 2011-07-28 LAB — COMPREHENSIVE METABOLIC PANEL
ALT: 31 U/L (ref 0–35)
AST: 21 U/L (ref 0–37)
Alkaline Phosphatase: 62 U/L (ref 39–117)
CO2: 22 mEq/L (ref 19–32)
Calcium: 9.7 mg/dL (ref 8.4–10.5)
GFR calc non Af Amer: 25 mL/min — ABNORMAL LOW (ref >60)
Potassium: 4.2 mEq/L (ref 3.5–5.1)
Sodium: 139 mEq/L (ref 135–145)
Total Protein: 6.1 g/dL (ref 6.0–8.3)

## 2011-07-28 LAB — DIFFERENTIAL
Basophils Absolute: 0 10*3/uL (ref 0.0–0.1)
Basophils Absolute: 0 10*3/uL (ref 0.0–0.1)
Eosinophils Relative: 0 % (ref 0–5)
Eosinophils Relative: 0 % (ref 0–5)
Lymphocytes Relative: 5 % — ABNORMAL LOW (ref 12–46)
Lymphocytes Relative: 5 % — ABNORMAL LOW (ref 12–46)
Lymphs Abs: 0.6 10*3/uL — ABNORMAL LOW (ref 0.7–4.0)
Lymphs Abs: 0.6 10*3/uL — ABNORMAL LOW (ref 0.7–4.0)
Monocytes Absolute: 0.3 10*3/uL (ref 0.1–1.0)
Monocytes Absolute: 0.5 10*3/uL (ref 0.1–1.0)
Monocytes Relative: 4 % (ref 3–12)
Neutro Abs: 11 10*3/uL — ABNORMAL HIGH (ref 1.7–7.7)
Neutro Abs: 9.5 10*3/uL — ABNORMAL HIGH (ref 1.7–7.7)

## 2011-07-28 LAB — MRSA PCR SCREENING: MRSA by PCR: NEGATIVE

## 2011-07-28 LAB — CARDIAC PANEL(CRET KIN+CKTOT+MB+TROPI): CK, MB: 5.1 ng/mL — ABNORMAL HIGH (ref 0.3–4.0)

## 2011-07-28 LAB — TROPONIN I: Troponin I: 0.54 ng/mL (ref <0.30)

## 2011-07-28 LAB — D-DIMER, QUANTITATIVE: D-Dimer, Quant: 0.96 ug/mL-FEU — ABNORMAL HIGH (ref 0.00–0.48)

## 2011-07-28 MED ORDER — ASPIRIN 81 MG PO CHEW
324.0000 mg | CHEWABLE_TABLET | Freq: Once | ORAL | Status: AC
  Administered 2011-07-28: 324 mg via ORAL
  Filled 2011-07-28: qty 4

## 2011-07-28 MED ORDER — FUROSEMIDE 10 MG/ML IJ SOLN
20.0000 mg | Freq: Once | INTRAMUSCULAR | Status: AC
  Administered 2011-07-28: 20 mg via INTRAVENOUS
  Filled 2011-07-28: qty 2

## 2011-07-28 MED ORDER — ENOXAPARIN SODIUM 60 MG/0.6ML ~~LOC~~ SOLN
1.0000 mg/kg | Freq: Once | SUBCUTANEOUS | Status: AC
  Administered 2011-07-28: 55 mg via SUBCUTANEOUS
  Filled 2011-07-28: qty 0.6

## 2011-07-28 MED ORDER — NITROGLYCERIN IN D5W 200-5 MCG/ML-% IV SOLN
10.0000 ug/min | INTRAVENOUS | Status: DC
  Administered 2011-07-28: 10 ug/min via INTRAVENOUS
  Filled 2011-07-28: qty 250

## 2011-07-28 MED ORDER — ONDANSETRON HCL 4 MG/2ML IJ SOLN
4.0000 mg | Freq: Once | INTRAMUSCULAR | Status: AC
  Administered 2011-07-28: 4 mg via INTRAVENOUS
  Filled 2011-07-28: qty 2

## 2011-07-28 NOTE — ED Notes (Signed)
[facility] called to give bed assignment on 3300 at [facility].  They will sent a truck soon.  Nurse informed.

## 2011-07-28 NOTE — ED Provider Notes (Addendum)
Scribed for Dr. Winfred Leeds, the patient was seen in room 4. This chart was scribed by Pincus Badder. This patient's care was started at 08:36.    Chief Complaint  Patient presents with  . Shortness of Breath   HPI [name] is a 77 y.o. healthy female who presents to the Emergency Department for mild respiratory distress and hypoxia. Patient complains of a gradual onset of trouble breathing, shortness of breath, and chest tightness which started Wednesday 07/25/11. She was seen at Urgent Care at that time, treated with a Zpak and Prednisone, and had a normal chest xray per patient. Patient reports no relief in symptoms after the Rx and shortness of breath got worse this AM. She states "I think the heat is doing it". Patient denies any associated productive cough, fever, or sneezing. She denies a history of bronchitis, asthma, or any lung problems.     Past Medical History  Diagnosis Date  . Hypertension   . Anemia   . GERD (gastroesophageal reflux disease)   Heart murmur  Hard of hearing.    Past Surgical History  Procedure Date  . Hernia repair   . Abdominal hysterectomy   . Tonsillectomy   . Cataract extraction   . Cystostomy     MEDICATIONS:  Previous Medications   ACETAMINOPHEN (TYLENOL) 325 MG TABLET    Take 650 mg by mouth every 6 (six) hours as needed. For pain    ASPIRIN EC 81 MG TABLET    Take 81 mg by mouth daily.     AZITHROMYCIN (ZITHROMAX) 250 MG TABLET    Take 250-500 mg by mouth daily. Patient takes 500 mg on day 1 and 250 mg on days 2-5   CALCIUM CARBONATE (CALTRATE 600 PO)    Take 1 tablet by mouth daily.     CRANBERRY 250 MG CAPS    Take 250 mg by mouth daily.     DIPHENHYDRAMINE (BENADRYL) 25 MG TABLET    Take 25 mg by mouth every 6 (six) hours as needed. For sleep    HYDROCHLOROTHIAZIDE 25 MG TABLET    Take 25 mg by mouth daily.     OMEPRAZOLE (PRILOSEC) 20 MG CAPSULE    Take 20 mg by mouth daily.     PREDNISONE (DELTASONE) 5 MG TABLET    Take 5 mg  by mouth daily. x6 days since 07/25/11    RAMIPRIL (ALTACE) 10 MG TABLET    Take 10 mg by mouth daily.       ALLERGIES:  Allergies as of 07/28/2011 - Review Complete 07/28/2011  Allergen Reaction Noted  . Morphine and related  07/28/2011  . Penicillins  07/28/2011  . Sulfa antibiotics  07/28/2011     Family History  Problem Relation Age of Onset  . Heart failure Father     History  Substance Use Topics  . Smoking status: Never Smoker   . Smokeless tobacco: Never Used  . Alcohol Use: No  Accompanied to the ED by daughter.   OB History    Grav Para Term Preterm Abortions TAB SAB Ect Mult Living   3 3 3       3       Review of Systems  Constitutional: Negative.  Negative for fever.  HENT: Negative.  Negative for sneezing.   Respiratory: Positive for cough, chest tightness and shortness of breath.   Cardiovascular: Negative.  Negative for leg swelling.  Gastrointestinal: Negative.   Musculoskeletal: Negative.   Skin: Negative.   Neurological:  Negative.   Hematological: Negative.   Psychiatric/Behavioral: Negative.   All other systems reviewed and are negative.    Physical Exam  BP 123/86  Pulse 124  Temp(Src) 98.1 F (36.7 C) (Oral)  Resp 27  Ht 5\' 6"  (1.676 m)  Wt 122 lb (55.339 kg)  BMI 19.69 kg/m2  SpO2 94%  Physical Exam  Constitutional: She appears well-developed and well-nourished.       Hard of hearing.   HENT:  Head: Normocephalic and atraumatic.  Eyes: Conjunctivae are normal. Pupils are equal, round, and reactive to light.  Neck: Neck supple. JVD present. No tracheal deviation present. No thyromegaly present.  Cardiovascular: Regular rhythm.  Tachycardia present.   Murmur heard.  Systolic murmur is present with a grade of 2/6       at the left sternal border.   Pulmonary/Chest: She is in respiratory distress (mild).       Scant crackles posteriorly.   Abdominal: Soft. Bowel sounds are normal. She exhibits no distension. There is no tenderness.    Musculoskeletal: Normal range of motion. She exhibits no edema and no tenderness.  Neurological: She is alert. Coordination normal.  Skin: Skin is warm and dry. No rash noted.  Psychiatric: She has a normal mood and affect.     OTHER DATA REVIEWED: Nursing notes, vital signs, and past medical records reviewed.   DIAGNOSTIC STUDIES: Oxygen Saturation is 88-90% on room air, hypoxic by my interpretation.    Cardiac Monitor: Sinus tachycardia with a rate of 128, no ectopy.   ED ECG REPORT  Date: 07/28/2011  EKG Time: 09:04 AM  Rate: 120 bpm  Rhythm: sinus tachycardia,  nonspecific ST and T waves changes, PAC's noted  Axis: normal   PR / QRS Intervals:normal  ST&T Change: nonspecific   Narrative Interpretation: no old EKG for comparison.    LABS / RADIOLOGY Results for orders placed during the hospital encounter of 07/28/11  CBC      Component Value Range   WBC 11.8 (*) 4.0 - 10.5 (K/uL)   RBC 3.94  3.87 - 5.11 (MIL/uL)   Hemoglobin 11.8 (*) 12.0 - 15.0 (g/dL)   HCT 35.9 (*) 36.0 - 46.0 (%)   MCV 91.1  78.0 - 100.0 (fL)   MCH 29.9  26.0 - 34.0 (pg)   MCHC 32.9  30.0 - 36.0 (g/dL)   RDW 12.8  11.5 - 15.5 (%)   Platelets 316  150 - 400 (K/uL)  DIFFERENTIAL      Component Value Range   Neutrophils Relative 93 (*) 43 - 77 (%)   Neutro Abs 11.0 (*) 1.7 - 7.7 (K/uL)   Lymphocytes Relative 5 (*) 12 - 46 (%)   Lymphs Abs 0.6 (*) 0.7 - 4.0 (K/uL)   Monocytes Relative 2 (*) 3 - 12 (%)   Monocytes Absolute 0.3  0.1 - 1.0 (K/uL)   Eosinophils Relative 0  0 - 5 (%)   Eosinophils Absolute 0.0  0.0 - 0.7 (K/uL)   Basophils Relative 0  0 - 1 (%)   Basophils Absolute 0.0  0.0 - 0.1 (K/uL)  BASIC METABOLIC PANEL      Component Value Range   Sodium 139  135 - 145 (mEq/L)   Potassium 4.2  3.5 - 5.1 (mEq/L)   Chloride 102  96 - 112 (mEq/L)   CO2 17 (*) 19 - 32 (mEq/L)   Glucose, Bld 231 (*) 70 - 99 (mg/dL)   BUN 50 (*) 6 - 23 (mg/dL)  Creatinine, Ser 1.60 (*) 0.50 - 1.10 (mg/dL)    Calcium 9.9  8.4 - 10.5 (mg/dL)   GFR calc non Af Amer 31 (*) >60 (mL/min)   GFR calc Af Amer 37 (*) >60 (mL/min)  TROPONIN I      Component Value Range   Troponin I 0.54 (*) <0.30 (ng/mL)  D-DIMER, QUANTITATIVE      Component Value Range   D-Dimer, Quant 0.96 (*) 0.00 - 0.48 (ug/mL-FEU)  PRO B NATRIURETIC PEPTIDE      Component Value Range   BNP, POC 62780.0 (*) 0 - 450 (pg/mL)   CXR: 1 View. Interpreted by Radiologist Dr. Kerby Moors and reviewed by Dr. Winfred Leeds: Congestive heart failure.   ED COURSE / COORDINATION OF CARE: 08:36 - BP: 139-113, P; 128, O2: 88-90% on room air. Patient placed on [place] so OP2 is greated than 92%. 09:38 - Re-examined, oxygen saturation improved to 96% on 2L of [place]. BP: 127/89, P:118. ED physician discussed results with the patient and family as well as recommendation for admission and further evaluation/treatment 10:09 - BNP elevated, Lasix ordered.  10:20 - Re-examined, patient speaks in full sentences, breathing improved, and she is in no respiratory distress.  10:23 - Troponin is elevated, Aspirin ordered.  10:31 - The case was discussed with Hospitalist, Dr. Reece Levy recommends transfer to [facility] in [place] as no cardiologist is present at [facility].  10:35 - ED physician discussed need for transfer to [facility] with patient and family.  10:51 - The case was discussed with [facility], Dr. Aundra Dubin - Patient stable for transfer. 1200 CRITICAL CARE NOTE.  Critical care time was provided for 30 minutes exclusive of separately billable procedures and treating other patients. This was necessary to treat or prevent further deterioration of the following condition(s) respiratory failure which the patient had and/or had a high probability of suddenly developing. This involved direct bedside patient care, speaking with family members, review of past medical records, reviewing the results of the laboratory and diagnostic studies,  consulting with other physicians, as well as evaluating the effectiveness of the therapy instituted as described.   MDM: Differential Diagnosis: AMI, CHF, STEMI,     IMPRESSION: 1. CHF  2. End STEMI    PLAN: Transfer to [facility]: Guarded.    MEDICATIONS GIVEN IN THE E.D.  Medications  omeprazole (PRILOSEC) 20 MG capsule (not administered)  hydrochlorothiazide 25 MG tablet (not administered)  ramipril (ALTACE) 10 MG tablet (not administered)  azithromycin (ZITHROMAX) 250 MG tablet (not administered)  predniSONE (DELTASONE) 5 MG tablet (not administered)  Calcium Carbonate (CALTRATE 600 PO) (not administered)  aspirin EC 81 MG tablet (not administered)  acetaminophen (TYLENOL) 325 MG tablet (not administered)  diphenhydrAMINE (BENADRYL) 25 MG tablet (not administered)  Cranberry 250 MG CAPS (not administered)  nitroGLYCERIN 200 mcg/mL in dextrose 5 % 250 mL infusion (10 mcg/min Intravenous New Bag 07/28/11 0958)  furosemide (LASIX) injection 20 mg (20 mg Intravenous Given 07/28/11 0958)  ondansetron (ZOFRAN) injection 4 mg (4 mg Intravenous Given 07/28/11 1005)  aspirin chewable tablet 324 mg (324 mg Oral Given 07/28/11 1031)       Procedures  I personally performed the services described in this documentation, which was scribed in my presence. The recorded information has been reviewed and considered. No att. providers found   Orlie Dakin, MD 07/28/11 [place], MD 08/25/11 [phone]

## 2011-07-28 NOTE — ED Notes (Signed)
[facility] in room picking up pt

## 2011-07-28 NOTE — ED Notes (Signed)
Patient started to have shortness of breath on Wednesday that has gotten gradual worse with heat. Patient reports going to urgent care on Wednesday and being diagnosed with bronchitis and sinus infection. Denies having any hx of COPD. Patient also reports having some chest tightness.

## 2011-07-28 NOTE — ED Notes (Signed)
CRITICAL VALUE ALERT  Critical value received:  Troponin 0.54  Date of notification:  07/28/2011  Time of notification:  [id number]  Critical value read back:  yes  Nurse who received alert:  Neila Gear RN  MD notified (1st page):  yes  Time of first page:  1021  MD notified (2nd page):  Time of second page:  Responding MD:  Winfred Leeds  Time MD responded:  1021

## 2011-07-29 DIAGNOSIS — I5023 Acute on chronic systolic (congestive) heart failure: Secondary | ICD-10-CM

## 2011-07-29 DIAGNOSIS — I359 Nonrheumatic aortic valve disorder, unspecified: Secondary | ICD-10-CM

## 2011-07-29 LAB — BASIC METABOLIC PANEL
CO2: 29 mEq/L (ref 19–32)
Calcium: 9.4 mg/dL (ref 8.4–10.5)
Chloride: 101 mEq/L (ref 96–112)
Creatinine, Ser: 1.99 mg/dL — ABNORMAL HIGH (ref 0.50–1.10)
GFR calc Af Amer: 28 mL/min — ABNORMAL LOW (ref >60)
GFR calc non Af Amer: 23 mL/min — ABNORMAL LOW (ref >60)
Glucose, Bld: 118 mg/dL — ABNORMAL HIGH (ref 70–99)
Glucose, Bld: 121 mg/dL — ABNORMAL HIGH (ref 70–99)
Sodium: 142 mEq/L (ref 135–145)
Sodium: 142 mEq/L (ref 135–145)

## 2011-07-29 LAB — CARDIAC PANEL(CRET KIN+CKTOT+MB+TROPI)
CK, MB: 5.2 ng/mL — ABNORMAL HIGH (ref 0.3–4.0)
CK, MB: 4.6 ng/mL — ABNORMAL HIGH (ref 0.3–4.0)
Relative Index: INVALID (ref 0.0–2.5)
Total CK: 73 U/L (ref 7–177)

## 2011-07-29 LAB — HEPARIN LEVEL (UNFRACTIONATED): Heparin Unfractionated: 0.31 IU/mL (ref 0.30–0.70)

## 2011-07-30 ENCOUNTER — Inpatient Hospital Stay (HOSPITAL_COMMUNITY): Payer: Medicare Other

## 2011-07-30 LAB — CBC
HCT: 33.1 % — ABNORMAL LOW (ref 36.0–46.0)
MCH: 29.6 pg (ref 26.0–34.0)
MCV: 89.2 fL (ref 78.0–100.0)
RBC: 3.71 MIL/uL — ABNORMAL LOW (ref 3.87–5.11)
WBC: 7.1 10*3/uL (ref 4.0–10.5)

## 2011-07-30 LAB — BASIC METABOLIC PANEL
BUN: 57 mg/dL — ABNORMAL HIGH (ref 6–23)
CO2: 28 mEq/L (ref 19–32)
Chloride: 105 mEq/L (ref 96–112)
Creatinine, Ser: 1.69 mg/dL — ABNORMAL HIGH (ref 0.50–1.10)
Glucose, Bld: 101 mg/dL — ABNORMAL HIGH (ref 70–99)

## 2011-07-31 DIAGNOSIS — I5021 Acute systolic (congestive) heart failure: Secondary | ICD-10-CM

## 2011-07-31 DIAGNOSIS — I10 Essential (primary) hypertension: Secondary | ICD-10-CM

## 2011-07-31 DIAGNOSIS — I359 Nonrheumatic aortic valve disorder, unspecified: Secondary | ICD-10-CM

## 2011-07-31 LAB — CBC
HCT: 35.5 % — ABNORMAL LOW (ref 36.0–46.0)
Hemoglobin: 11.7 g/dL — ABNORMAL LOW (ref 12.0–15.0)
MCH: 29.6 pg (ref 26.0–34.0)
MCHC: 33 g/dL (ref 30.0–36.0)
MCV: 89.9 fL (ref 78.0–100.0)

## 2011-07-31 LAB — BASIC METABOLIC PANEL
BUN: 55 mg/dL — ABNORMAL HIGH (ref 6–23)
CO2: 30 mEq/L (ref 19–32)
Calcium: 9.6 mg/dL (ref 8.4–10.5)
Creatinine, Ser: 1.65 mg/dL — ABNORMAL HIGH (ref 0.50–1.10)
Glucose, Bld: 99 mg/dL (ref 70–99)
Sodium: 144 mEq/L (ref 135–145)

## 2011-08-01 ENCOUNTER — Inpatient Hospital Stay (HOSPITAL_COMMUNITY): Payer: Medicare Other

## 2011-08-01 LAB — CBC
HCT: 37.2 % (ref 36.0–46.0)
MCV: 89.4 fL (ref 78.0–100.0)
RDW: 12.5 % (ref 11.5–15.5)
WBC: 7.7 10*3/uL (ref 4.0–10.5)

## 2011-08-01 LAB — BASIC METABOLIC PANEL
BUN: 56 mg/dL — ABNORMAL HIGH (ref 6–23)
GFR calc non Af Amer: 29 mL/min — ABNORMAL LOW (ref >60)
Glucose, Bld: 111 mg/dL — ABNORMAL HIGH (ref 70–99)
Potassium: 3.2 mEq/L — ABNORMAL LOW (ref 3.5–5.1)

## 2011-08-02 ENCOUNTER — Inpatient Hospital Stay (HOSPITAL_COMMUNITY): Payer: Medicare Other

## 2011-08-02 DIAGNOSIS — I2510 Atherosclerotic heart disease of native coronary artery without angina pectoris: Secondary | ICD-10-CM

## 2011-08-02 DIAGNOSIS — Z01810 Encounter for preprocedural cardiovascular examination: Secondary | ICD-10-CM

## 2011-08-02 LAB — BASIC METABOLIC PANEL
BUN: 54 mg/dL — ABNORMAL HIGH (ref 6–23)
CO2: 29 mEq/L (ref 19–32)
Calcium: 9.6 mg/dL (ref 8.4–10.5)
Creatinine, Ser: 1.79 mg/dL — ABNORMAL HIGH (ref 0.50–1.10)
Glucose, Bld: 106 mg/dL — ABNORMAL HIGH (ref 70–99)

## 2011-08-02 LAB — CBC
HCT: 37.5 % (ref 36.0–46.0)
Hemoglobin: 12.2 g/dL (ref 12.0–15.0)
MCH: 29.4 pg (ref 26.0–34.0)
MCHC: 32.5 g/dL (ref 30.0–36.0)

## 2011-08-02 LAB — HEPARIN LEVEL (UNFRACTIONATED): Heparin Unfractionated: 0.4 IU/mL (ref 0.30–0.70)

## 2011-08-03 DIAGNOSIS — I2510 Atherosclerotic heart disease of native coronary artery without angina pectoris: Secondary | ICD-10-CM

## 2011-08-03 LAB — BASIC METABOLIC PANEL
BUN: 53 mg/dL — ABNORMAL HIGH (ref 6–23)
Calcium: 9.7 mg/dL (ref 8.4–10.5)
Creatinine, Ser: 1.57 mg/dL — ABNORMAL HIGH (ref 0.50–1.10)
GFR calc non Af Amer: 31 mL/min — ABNORMAL LOW (ref >60)
Glucose, Bld: 98 mg/dL (ref 70–99)
Sodium: 143 mEq/L (ref 135–145)

## 2011-08-03 LAB — URINALYSIS, ROUTINE W REFLEX MICROSCOPIC
Glucose, UA: NEGATIVE mg/dL
Ketones, ur: NEGATIVE mg/dL
Protein, ur: 30 mg/dL — AB

## 2011-08-03 LAB — CBC
HCT: 36.6 % (ref 36.0–46.0)
Hemoglobin: 12.1 g/dL (ref 12.0–15.0)
RDW: 12.7 % (ref 11.5–15.5)
WBC: 8.3 10*3/uL (ref 4.0–10.5)

## 2011-08-03 LAB — HEPARIN LEVEL (UNFRACTIONATED): Heparin Unfractionated: 0.41 IU/mL (ref 0.30–0.70)

## 2011-08-03 LAB — URINE MICROSCOPIC-ADD ON

## 2011-08-04 DIAGNOSIS — I2510 Atherosclerotic heart disease of native coronary artery without angina pectoris: Secondary | ICD-10-CM

## 2011-08-04 LAB — CBC
MCH: 29.3 pg (ref 26.0–34.0)
MCHC: 32.4 g/dL (ref 30.0–36.0)
MCV: 90.6 fL (ref 78.0–100.0)
Platelets: 264 10*3/uL (ref 150–400)
RDW: 12.7 % (ref 11.5–15.5)

## 2011-08-04 LAB — BASIC METABOLIC PANEL
Calcium: 10.1 mg/dL (ref 8.4–10.5)
Creatinine, Ser: 1.35 mg/dL — ABNORMAL HIGH (ref 0.50–1.10)
GFR calc Af Amer: 45 mL/min — ABNORMAL LOW (ref >60)

## 2011-08-05 LAB — BASIC METABOLIC PANEL
CO2: 31 mEq/L (ref 19–32)
GFR calc non Af Amer: 23 mL/min — ABNORMAL LOW (ref >60)
Glucose, Bld: 119 mg/dL — ABNORMAL HIGH (ref 70–99)
Potassium: 4.5 mEq/L (ref 3.5–5.1)
Sodium: 142 mEq/L (ref 135–145)

## 2011-08-05 LAB — URINE CULTURE

## 2011-08-06 LAB — BASIC METABOLIC PANEL
BUN: 57 mg/dL — ABNORMAL HIGH (ref 6–23)
Chloride: 100 mEq/L (ref 96–112)
GFR calc Af Amer: 20 mL/min — ABNORMAL LOW (ref >60)
Potassium: 4.1 mEq/L (ref 3.5–5.1)
Sodium: 142 mEq/L (ref 135–145)

## 2011-08-07 LAB — CBC
HCT: 38.1 % (ref 36.0–46.0)
Hemoglobin: 12.6 g/dL (ref 12.0–15.0)
MCV: 89.9 fL (ref 78.0–100.0)
RBC: 4.24 MIL/uL (ref 3.87–5.11)
RDW: 12.9 % (ref 11.5–15.5)
WBC: 10.8 10*3/uL — ABNORMAL HIGH (ref 4.0–10.5)

## 2011-08-07 LAB — BASIC METABOLIC PANEL
BUN: 71 mg/dL — ABNORMAL HIGH (ref 6–23)
CO2: 31 mEq/L (ref 19–32)
Chloride: 100 mEq/L (ref 96–112)
Creatinine, Ser: 3.34 mg/dL — ABNORMAL HIGH (ref 0.50–1.10)
Glucose, Bld: 100 mg/dL — ABNORMAL HIGH (ref 70–99)
Potassium: 4.6 mEq/L (ref 3.5–5.1)

## 2011-08-07 LAB — DIFFERENTIAL
Eosinophils Relative: 0 % (ref 0–5)
Lymphocytes Relative: 14 % (ref 12–46)
Lymphs Abs: 1.5 10*3/uL (ref 0.7–4.0)
Neutro Abs: 8.5 10*3/uL — ABNORMAL HIGH (ref 1.7–7.7)

## 2011-08-08 LAB — CBC
HCT: 34.5 % — ABNORMAL LOW (ref 36.0–46.0)
MCH: 30.2 pg (ref 26.0–34.0)
MCV: 89.8 fL (ref 78.0–100.0)
Platelets: 196 10*3/uL (ref 150–400)
RDW: 12.9 % (ref 11.5–15.5)

## 2011-08-08 LAB — BASIC METABOLIC PANEL
BUN: 65 mg/dL — ABNORMAL HIGH (ref 6–23)
CO2: 28 mEq/L (ref 19–32)
Calcium: 9.1 mg/dL (ref 8.4–10.5)
Chloride: 102 mEq/L (ref 96–112)
Creatinine, Ser: 2.79 mg/dL — ABNORMAL HIGH (ref 0.50–1.10)
GFR calc Af Amer: 19 mL/min — ABNORMAL LOW (ref >60)

## 2011-08-08 LAB — DIFFERENTIAL
Eosinophils Absolute: 0.1 10*3/uL (ref 0.0–0.7)
Eosinophils Relative: 1 % (ref 0–5)
Lymphocytes Relative: 19 % (ref 12–46)
Lymphs Abs: 1.4 10*3/uL (ref 0.7–4.0)
Monocytes Absolute: 0.7 10*3/uL (ref 0.1–1.0)
Monocytes Relative: 9 % (ref 3–12)

## 2011-08-09 LAB — BASIC METABOLIC PANEL
CO2: 27 mEq/L (ref 19–32)
Calcium: 9.4 mg/dL (ref 8.4–10.5)
Creatinine, Ser: 2.17 mg/dL — ABNORMAL HIGH (ref 0.50–1.10)
GFR calc non Af Amer: 22 mL/min — ABNORMAL LOW (ref >60)
Glucose, Bld: 101 mg/dL — ABNORMAL HIGH (ref 70–99)
Sodium: 140 mEq/L (ref 135–145)

## 2011-08-09 LAB — ABO/RH: ABO/RH(D): O POS

## 2011-08-10 ENCOUNTER — Other Ambulatory Visit: Payer: Self-pay | Admitting: Surgery

## 2011-08-10 ENCOUNTER — Inpatient Hospital Stay (HOSPITAL_COMMUNITY): Payer: Medicare Other

## 2011-08-10 DIAGNOSIS — I2510 Atherosclerotic heart disease of native coronary artery without angina pectoris: Secondary | ICD-10-CM

## 2011-08-10 DIAGNOSIS — I359 Nonrheumatic aortic valve disorder, unspecified: Secondary | ICD-10-CM

## 2011-08-10 HISTORY — PX: AORTIC VALVE REPLACEMENT: SHX41

## 2011-08-10 LAB — POCT I-STAT 3, ART BLOOD GAS (G3+)
Acid-Base Excess: 1 mmol/L (ref 0.0–2.0)
Bicarbonate: 25.3 mEq/L — ABNORMAL HIGH (ref 20.0–24.0)
Bicarbonate: 25 mEq/L — ABNORMAL HIGH (ref 20.0–24.0)
Bicarbonate: 23.8 mEq/L (ref 20.0–24.0)
TCO2: 25 mmol/L (ref 0–100)
pCO2 arterial: 46.2 mmHg — ABNORMAL HIGH (ref 35.0–45.0)
pCO2 arterial: 33.2 mmHg — ABNORMAL LOW (ref 35.0–45.0)
pCO2 arterial: 33.5 mmHg — ABNORMAL LOW (ref 35.0–45.0)
pH, Arterial: 7.347 — ABNORMAL LOW (ref 7.350–7.400)
pH, Arterial: 7.49 — ABNORMAL HIGH (ref 7.350–7.400)
pH, Arterial: 7.45 — ABNORMAL HIGH (ref 7.350–7.400)
pO2, Arterial: 339 mmHg — ABNORMAL HIGH (ref 80.0–100.0)
pO2, Arterial: 215 mmHg — ABNORMAL HIGH (ref 80.0–100.0)

## 2011-08-10 LAB — BASIC METABOLIC PANEL
CO2: 30 mEq/L (ref 19–32)
Chloride: 105 mEq/L (ref 96–112)
GFR calc non Af Amer: 25 mL/min — ABNORMAL LOW (ref >60)
Glucose, Bld: 102 mg/dL — ABNORMAL HIGH (ref 70–99)
Potassium: 3.6 mEq/L (ref 3.5–5.1)
Sodium: 141 mEq/L (ref 135–145)

## 2011-08-10 LAB — POCT I-STAT 4, (NA,K, GLUC, HGB,HCT)
Glucose, Bld: 112 mg/dL — ABNORMAL HIGH (ref 70–99)
Glucose, Bld: 136 mg/dL — ABNORMAL HIGH (ref 70–99)
HCT: 29 % — ABNORMAL LOW (ref 36.0–46.0)
HCT: 27 % — ABNORMAL LOW (ref 36.0–46.0)
HCT: 26 % — ABNORMAL LOW (ref 36.0–46.0)
HCT: 27 % — ABNORMAL LOW (ref 36.0–46.0)
Hemoglobin: 9.5 g/dL — ABNORMAL LOW (ref 12.0–15.0)
Hemoglobin: 6.5 g/dL — CL (ref 12.0–15.0)
Hemoglobin: 8.8 g/dL — ABNORMAL LOW (ref 12.0–15.0)
Hemoglobin: 9.2 g/dL — ABNORMAL LOW (ref 12.0–15.0)
Potassium: 3.4 mEq/L — ABNORMAL LOW (ref 3.5–5.1)
Potassium: 5.2 mEq/L — ABNORMAL HIGH (ref 3.5–5.1)
Potassium: 3.6 mEq/L (ref 3.5–5.1)
Sodium: 140 mEq/L (ref 135–145)
Sodium: 141 mEq/L (ref 135–145)
Sodium: 129 mEq/L — ABNORMAL LOW (ref 135–145)
Sodium: 137 mEq/L (ref 135–145)

## 2011-08-10 LAB — CBC
HCT: 34.1 % — ABNORMAL LOW (ref 36.0–46.0)
Hemoglobin: 11.1 g/dL — ABNORMAL LOW (ref 12.0–15.0)
Hemoglobin: 10 g/dL — ABNORMAL LOW (ref 12.0–15.0)
MCH: 29.2 pg (ref 26.0–34.0)
MCHC: 32.6 g/dL (ref 30.0–36.0)
MCV: 86.6 fL (ref 78.0–100.0)
RBC: 3.81 MIL/uL — ABNORMAL LOW (ref 3.87–5.11)
RBC: 3.43 MIL/uL — ABNORMAL LOW (ref 3.87–5.11)
WBC: 5.3 10*3/uL (ref 4.0–10.5)

## 2011-08-10 LAB — HEMOGLOBIN AND HEMATOCRIT, BLOOD: HCT: 25.3 % — ABNORMAL LOW (ref 36.0–46.0)

## 2011-08-11 ENCOUNTER — Inpatient Hospital Stay (HOSPITAL_COMMUNITY): Payer: Medicare Other

## 2011-08-11 LAB — POCT I-STAT 3, ART BLOOD GAS (G3+)
Acid-base deficit: 1 mmol/L (ref 0.0–2.0)
Acid-base deficit: 1 mmol/L (ref 0.0–2.0)
Acid-base deficit: 3 mmol/L — ABNORMAL HIGH (ref 0.0–2.0)
O2 Saturation: 98 %
O2 Saturation: 98 %
O2 Saturation: 98 %
Patient temperature: 37.1
Patient temperature: 37.1
TCO2: 24 mmol/L (ref 0–100)
TCO2: 25 mmol/L (ref 0–100)
TCO2: 26 mmol/L (ref 0–100)
pCO2 arterial: 40.3 mmHg (ref 35.0–45.0)
pCO2 arterial: 40.8 mmHg (ref 35.0–45.0)

## 2011-08-11 LAB — CBC
HCT: 29 % — ABNORMAL LOW (ref 36.0–46.0)
HCT: 28.2 % — ABNORMAL LOW (ref 36.0–46.0)
MCH: 29.6 pg (ref 26.0–34.0)
MCHC: 34.1 g/dL (ref 30.0–36.0)
MCV: 89.2 fL (ref 78.0–100.0)
Platelets: 101 10*3/uL — ABNORMAL LOW (ref 150–400)
RBC: 3.16 MIL/uL — ABNORMAL LOW (ref 3.87–5.11)
RDW: 13.6 % (ref 11.5–15.5)
RDW: 14.1 % (ref 11.5–15.5)
WBC: 16.6 10*3/uL — ABNORMAL HIGH (ref 4.0–10.5)

## 2011-08-11 LAB — PREPARE PLATELET PHERESIS

## 2011-08-11 LAB — POCT I-STAT, CHEM 8
BUN: 22 mg/dL (ref 6–23)
Calcium, Ion: 1.14 mmol/L (ref 1.12–1.32)
Chloride: 110 mEq/L (ref 96–112)
HCT: 26 % — ABNORMAL LOW (ref 36.0–46.0)
Sodium: 147 mEq/L — ABNORMAL HIGH (ref 135–145)
TCO2: 21 mmol/L (ref 0–100)

## 2011-08-11 LAB — GLUCOSE, CAPILLARY: Glucose-Capillary: 119 mg/dL — ABNORMAL HIGH (ref 70–99)

## 2011-08-11 LAB — BASIC METABOLIC PANEL
BUN: 27 mg/dL — ABNORMAL HIGH (ref 6–23)
Calcium: 8 mg/dL — ABNORMAL LOW (ref 8.4–10.5)
GFR calc Af Amer: 42 mL/min — ABNORMAL LOW (ref >60)
GFR calc non Af Amer: 35 mL/min — ABNORMAL LOW (ref >60)
Potassium: 4 mEq/L (ref 3.5–5.1)

## 2011-08-11 LAB — MAGNESIUM: Magnesium: 1.8 mg/dL (ref 1.5–2.5)

## 2011-08-11 LAB — CREATININE, SERUM
GFR calc Af Amer: 42 mL/min — ABNORMAL LOW (ref >60)
GFR calc non Af Amer: 35 mL/min — ABNORMAL LOW (ref >60)

## 2011-08-12 ENCOUNTER — Inpatient Hospital Stay (HOSPITAL_COMMUNITY): Payer: Medicare Other

## 2011-08-12 LAB — BASIC METABOLIC PANEL
BUN: 25 mg/dL — ABNORMAL HIGH (ref 6–23)
Creatinine, Ser: 1.57 mg/dL — ABNORMAL HIGH (ref 0.50–1.10)
GFR calc Af Amer: 38 mL/min — ABNORMAL LOW (ref >60)
GFR calc non Af Amer: 31 mL/min — ABNORMAL LOW (ref >60)
Glucose, Bld: 109 mg/dL — ABNORMAL HIGH (ref 70–99)
Potassium: 3.6 mEq/L (ref 3.5–5.1)

## 2011-08-12 LAB — CROSSMATCH
Antibody Screen: NEGATIVE
Unit division: 0
Unit division: 0

## 2011-08-12 LAB — GLUCOSE, CAPILLARY
Glucose-Capillary: 97 mg/dL (ref 70–99)
Glucose-Capillary: 83 mg/dL (ref 70–99)
Glucose-Capillary: 84 mg/dL (ref 70–99)
Glucose-Capillary: 110 mg/dL — ABNORMAL HIGH (ref 70–99)
Glucose-Capillary: 107 mg/dL — ABNORMAL HIGH (ref 70–99)

## 2011-08-12 LAB — CBC
HCT: 25.6 % — ABNORMAL LOW (ref 36.0–46.0)
Hemoglobin: 8.5 g/dL — ABNORMAL LOW (ref 12.0–15.0)
MCHC: 33.2 g/dL (ref 30.0–36.0)
MCV: 90.1 fL (ref 78.0–100.0)
RDW: 14.2 % (ref 11.5–15.5)

## 2011-08-13 DIAGNOSIS — I4891 Unspecified atrial fibrillation: Secondary | ICD-10-CM

## 2011-08-13 LAB — BASIC METABOLIC PANEL
BUN: 29 mg/dL — ABNORMAL HIGH (ref 6–23)
CO2: 26 mEq/L (ref 19–32)
Calcium: 8.8 mg/dL (ref 8.4–10.5)
Creatinine, Ser: 1.6 mg/dL — ABNORMAL HIGH (ref 0.50–1.10)
GFR calc non Af Amer: 31 mL/min — ABNORMAL LOW (ref >60)
Glucose, Bld: 111 mg/dL — ABNORMAL HIGH (ref 70–99)

## 2011-08-13 LAB — CBC
Hemoglobin: 8.6 g/dL — ABNORMAL LOW (ref 12.0–15.0)
MCH: 29.8 pg (ref 26.0–34.0)
MCHC: 32.8 g/dL (ref 30.0–36.0)
MCV: 90.7 fL (ref 78.0–100.0)
RBC: 2.89 MIL/uL — ABNORMAL LOW (ref 3.87–5.11)

## 2011-08-14 ENCOUNTER — Inpatient Hospital Stay (HOSPITAL_COMMUNITY): Payer: Medicare Other

## 2011-08-14 LAB — BASIC METABOLIC PANEL
CO2: 25 mEq/L (ref 19–32)
Calcium: 8.9 mg/dL (ref 8.4–10.5)
Creatinine, Ser: 1.5 mg/dL — ABNORMAL HIGH (ref 0.50–1.10)
GFR calc Af Amer: 40 mL/min — ABNORMAL LOW (ref >60)
GFR calc non Af Amer: 33 mL/min — ABNORMAL LOW (ref >60)
Sodium: 145 mEq/L (ref 135–145)

## 2011-08-14 LAB — CBC
MCH: 29.5 pg (ref 26.0–34.0)
MCHC: 32.4 g/dL (ref 30.0–36.0)
MCV: 91 fL (ref 78.0–100.0)
Platelets: 125 10*3/uL — ABNORMAL LOW (ref 150–400)
RDW: 14.1 % (ref 11.5–15.5)

## 2011-08-15 ENCOUNTER — Inpatient Hospital Stay (HOSPITAL_COMMUNITY): Payer: Medicare Other

## 2011-08-15 LAB — BASIC METABOLIC PANEL
Chloride: 114 mEq/L — ABNORMAL HIGH (ref 96–112)
Creatinine, Ser: 1.66 mg/dL — ABNORMAL HIGH (ref 0.50–1.10)
GFR calc Af Amer: 35 mL/min — ABNORMAL LOW (ref >60)
Sodium: 147 mEq/L — ABNORMAL HIGH (ref 135–145)

## 2011-08-15 LAB — CBC
MCV: 91.2 fL (ref 78.0–100.0)
Platelets: 155 10*3/uL (ref 150–400)
RDW: 14.1 % (ref 11.5–15.5)
WBC: 10.5 10*3/uL (ref 4.0–10.5)

## 2011-08-16 LAB — BASIC METABOLIC PANEL
CO2: 27 mEq/L (ref 19–32)
Calcium: 9.5 mg/dL (ref 8.4–10.5)
Creatinine, Ser: 1.66 mg/dL — ABNORMAL HIGH (ref 0.50–1.10)
Glucose, Bld: 116 mg/dL — ABNORMAL HIGH (ref 70–99)

## 2011-08-16 NOTE — Op Note (Signed)
[name], [name]             ACCOUNT NO.:  [id number]  MEDICAL RECORD NO.:  [id number]  LOCATION:  [id number]                         FACILITY:  [facility]  PHYSICIAN:  Ala Dach, M.D.DATE OF BIRTH:  1922/05/26  DATE OF PROCEDURE:  08/10/2011 DATE OF DISCHARGE:                              OPERATIVE REPORT   SURGEON:  Ala Dach, MD  Intraoperative transesophageal cardiographic report.  INDICATIONS FOR PROCEDURE:  [name] is an 77 year old patient of Dr. Kirk Ruths [facility]Lean's who presents today for aortic valve replacement and possible coronary artery bypass graft by Dr. Gilford Raid.  She is brought to the holding area.  After sedation, pulmonary artery and radial arterial lines were placed.  She was then taken to OR for routine induction of general anesthesia after which the TEE probe was prepared and passed oropharyngeally into the stomach before slightly withdrawn to image of the cardiac structures.  PRECARDIOPULMONARY BYPASS TEE EXAMINATION:  Left ventricle:  The left ventricular chamber is initially seen in a short-axis view.  There is mild-to-moderate left ventricular hypertrophy that appear symmetrical. There is some moderately depressed left ventricular function with an estimated ejection fraction of just over 30%.  There is slight diminution of contractility appreciated in the septal wall and then the where the septal wall meet the inferior or posterior wall of the left ventricular chamber in this short-axis view.  Long-axis views are obtained as well and papillary muscles are well outlined.  There are no other masses appreciated within. Mitral valve:  The mitral valve is seen initially in the four-chamber view.  Leaflets are mildly thickened; however, there is normal function with appropriate opening and coaptation of the mitral valve.  Color Doppler reveals trace mitral regurgitant flow. Left atrium:  The left atrial chamber is interrogated, it is a  normal size and there are no masses appreciated within.  The interatrial septum is interrogated and appears to be intact. Right ventricle:  The tricuspid valve and the right atrium are normal structures and with normal function. Aortic valve:  The initial views of the aortic valve were difficult due to the heavy calcium and therefore, ultrasounds signal dropout. However, the short axis review appears to show that there are in fact three cusps, though they are heavily calcified with fusion of several of the leaflet edges.  There is minimal opening.  There is severe restriction of the movement of the leaflets seen in both the short-axis and long-axis view.  Both short-axis and long-axis views are obtained. Color Doppler over this reveals essentially no aortic regurgitant flow; however, there is the presence of a turbulent, therefore stenotic and somewhat eccentric jet during systolic traction across this severe narrowing of the orifice.  Previous estimate had been what looked that the valve was critically stenosed, the level was 0.7 centimeter squared. Deep transgastric views could not be obtained.  Therefore, determination of valvular area using this methodology was unable to be performed.  The patient was placed on cardiopulmonary bypass.  Coronary artery bypass grafting and an aortic valve replacement is carried out.  The patient is rewarmed and separated from cardiopulmonary bypass with the initial attempt.  POSTCARDIOPULMONARY BYPASS TEE EXAMINATION:  Left ventricle:  The  left ventricular chamber is now seen in a short-axis view with the addition of inotropes and pacing.  There is more vigorous contractility noted in the left ventricular chamber at this time as viewed from the short-axis view.  Estimated ejection fraction now seen as above 35% with good contractile pattern appreciated in all segmental wall areas. Aortic valve:  In place of that this area of valve could now be  seen, then fine leaflet edges of the tissue valve that has been placed.  This valve appeared to be seated well normally and functioning in an entirely appropriate manner.  Color Doppler in a long-axis view shows essentially no obstruction to flow and no aortic insufficiency or perivalvular leaks are appreciated.  This is considered to be normal and usual repair.  The rest of the cardiac examination without significant changes and the patient was returned to the cardiac intensive care unit in stable condition.          ______________________________ Ala Dach, M.D.     JTM/MEDQ  D:  08/11/2011  T:  08/11/2011  Job:  [id number]  Electronically Signed by Greta Doom M.D. on 08/16/2011 01:13:28 AM

## 2011-08-20 NOTE — Op Note (Signed)
[name], [name]             ACCOUNT NO.:  [id number]  MEDICAL RECORD NO.:  [id number]  LOCATION:  [id number]                         FACILITY:  [facility]  PHYSICIAN:  Gilford Raid, M.D.     DATE OF BIRTH:  1922/05/13  DATE OF PROCEDURE:  08/10/2011 DATE OF DISCHARGE:                              OPERATIVE REPORT   PREOPERATIVE DIAGNOSIS:  Critical aortic stenosis and single-vessel coronary artery disease.  POSTOPERATIVE DIAGNOSES:  Critical aortic stenosis and single-vessel coronary artery disease.  PROCEDURES:  Median sternotomy, extracorporeal circulation, coronary artery bypass graft surgery x1 using a left internal mammary graft to the left anterior descending coronary artery, aortic valve replacement using a 23-mm Edwards pericardial Magna-Ease valve.  ATTENDING SURGEON:  Gilford Raid, MD  ASSISTANT:  John Giovanni, PA-C  ANESTHESIA:  General endotracheal.  CLINICAL HISTORY:  This patient is an 77 year old woman who is veryindependent with a history of hypertension, hyperlipidemia, and known heart murmur who presented with chest tightness and shortness of breath. She was treated in [facility] [place] for possible bronchitis and sinus infection with steroids and antibiotics without improvement.  On the night of July 27, 2011, she had chest discomfort and shortness of breath all night long, could not sleep, and was admitted on July 28, 2011, with an elevated troponin of 0.63 and a CPK-MB of 5.1.  Electrocardiogram showed no significant changes.  Her BNP was elevated at 62,780.  Chest x- ray showed pulmonary edema.  A 2-D bedside echocardiogram showed severe aortic stenosis with a calcified valve with an aortic valve area of 0.7 sq cm.  The mean gradient was 38 and peak gradient was 57.  There was mild to moderate mitral regurgitation, mild tricuspid regurgitation, and moderate pulmonary hypertension.  PA pressure was estimated at 55 mmHg. Ejection fraction was reduced to  about 20% with mild right ventricular dysfunction.  She was treated with nesiritide and diuresis with improvement, but her creatinine which was 1.9 on admission went up to about 2.  After her diuresis was decreased, her creatinine dropped back down about 1.65.  She underwent catheterization which showed significant single-vessel coronary artery disease with about 99% ostial stenosis of the small first diagonal.  There was about 80% calcified mid LAD stenosis just after the takeoff of the large third diagonal.  The left circumflex and right coronary systems had mild insignificant disease. After review of the catheterization, it was felt that aortic valve replacement and coronary artery bypass to the LAD was the best treatment.  Unfortunately, her creatinine went back up after catheterization and peaked at about 3.  Therefore, we postponed the surgery and waited for her creatinine to come back down.  This morning, it dropped down to about 1.9, which was about her baseline and I felt the best to proceed with surgery since she did have critical aortic stenosis.  I discussed the operative procedure with the patient and her family including alternatives, benefits, and risks including but not limited to bleeding, blood transfusion, infection, stroke, myocardial infarction, graft failure, heart block requiring permanent pacemaker, organ dysfunction including complete renal failure requiring dialysis, and death.  She understood all of this and agreed to proceed.  OPERATIVE PROCEDURE:  The patient was taken to the operating room and placed on the table in supine position.  After induction of general endotracheal anesthesia, a Foley catheter was placed in the bladder using sterile technique.  Then, the chest, abdomen, and both lower extremities were prepped and draped in the usual sterile manner. Transesophageal echocardiogram was performed by Dr. Rica Koyanagi. This showed severe calcific aortic  stenosis.  Left ventricular function appeared mildly depressed with some mild left ventricular dilatation, but the ventricle certainly looked a lot better than it did at the time of her initial echocardiogram.  There was trivial mitral regurgitation.  Then, the chest was opened through a median sternotomy incision.  The pericardium was opened in the midline.  Examination of the heart showed good ventricular contractility.  The ascending aorta was mildly enlarged with what appeared to be poststenotic dilatation.  The maximum diameter that we had measured by echo was about 3.5 cm.  She had no plaque in her aorta.  Then, the left internal mammary artery was harvested from the chest wall as a pedicle graft.  This was a medium-caliber vessel with excellent blood flow through it.  Then, the patient was heparinized and when an adequate ACT was obtained the distal ascending aorta was cannulated using a 20-French aortic cannula for arterial inflow.  Venous outflow was achieved using a two- stage venous cannula through the right atrial appendage.  The antegrade cardioplegia and vent cannula was inserted in the aortic root.  The patient was placed on cardiopulmonary bypass and distal coronary was identified.  The LAD was a moderate-sized vessel with no distal disease in it beyond the mid vessel stenosis.  Then, a left ventricular vent was placed through the right superior pulmonary vein and a retrograde cardioplegic cannula was placed through a purse-string suture in the right atrium and advanced to the coronary sinus.  Then, the aorta was crossclamped and 800 mL of cold blood antegrade cardioplegia was administered in the aortic root with quick arrest of the heart.  Systemic hypothermia to 32 degrees centigrade and topical hypothermic iced saline was used.  A temperature probe was placed in the septum insulating pad in the pericardium.  The first distal anastomosis was then performed to the  distal LAD.  The internal diameter was about 1.75 mm.  The conduit used was the left internal mammary graft and it was brought through an opening of the left pericardium anterior to the phrenic nerve.  This was anastomosed to the LAD in an end-to-side manner using continuous 8-0 Prolene suture.  The pedicle was sutured to the epicardium with 6-0 Prolene sutures.  Then, the patient was given another dose of antegrade cardioplegia.  The aortic valve replacement was begun and additional doses of cold blood retrograde cardioplegia were given throughout that part of procedure about every 20 minutes to maintain myocardial temperature around 10 degrees centigrade or less.  The aorta was opened transversely about 1 cm above the sinotubular junction.  Examination of the native valve showed that there were three leaflets with complete calcification and complete fusion of the left and right coronary cusps.  This area was completely immobile.  The noncoronary cusp was also heavily calcified, but partially mobile.  The left and right coronary ostia were identified and were not calcified or obstructed.  Then, the native valve was excised.  The annulus was decalcified with rongeurs.  Care was taken to remove all particulate debris.  Left ventricle and aortic root were copiously irrigated with iced saline  solution.  Then, the annulus was sized and a 23-mm Edwards pericardial Magna-Ease valve was chosen.  This had model number [id number], serial number [id number].  Then, a series of pledgeted 2-0 Ethibond horizontal mattress sutures were placed around the aortic annulus with pledgets in the subannular position.  The sutures were placed through the sewing ring and the valve lowered in place.  The sutures were tied sequentially.  The valve seated nicely.  The coronary ostia were not obstructed.  The patient was then rewarmed to 37 degrees centigrade. The aortotomy was closed in two layers using continuous 4-0  Prolene suture with felt strips to reinforce the closure.  CO2 was blown into the pericardial cavity throughout the procedure to try and minimize intracardiac air.  Then, the left side of the heart was deaired.  Head was placed in Trendelenburg position.  The clamp was removed from mammary pedicle.  There was rapid warming of the ventricular septum and return of spontaneous ventricular fibrillation.  The crossclamp was removed at a time of 94 minutes and the patient was defibrillated into sinus rhythm.  The aortotomy appeared hemostatic.  Two temporary right ventricular and right atrial pacing wires were placed and brought out through the skin.  When the patient was rewarmed to 37 degrees centigrade, the left ventricular vent and retrograde cardioplegic cannulas were removed.  The patient was then weaned from cardiopulmonary bypass on no inotropic agents.  Total bypass time was 122 minutes.  TEE showed improved cardiac function with normal PA pressures.  There was a normal functioning aortic valve prosthesis without evidence of perivalvular leak or regurgitation to the valve.  There was trivial mitral regurgitation.  Right heart function looked normal.  Then, protamine was given.  The venous and aortic cannulas were removed without difficulty.  Hemostasis was achieved.  Three chest tubes were placed with a tube in the posterior pericardium, one in the left pleural space, and one in the anterior mediastinum.  The sternum was then reapproximated with #6 stainless steel wires.  The fascia was closed with continuous #1 Vicryl suture.  Subcutaneous tissue was closed with continuous 2-0 Vicryl and the skin with 3-0 Vicryl subcuticular closure. The sponge, needle, and instrument counts were correct according to the scrub nurse.  Dry sterile dressing was applied over the incision and around the chest tubes which were hooked with Pleur-Evac suction.  The patient remained hemodynamically stable and  transferred to the SICU in guarded but stable condition.     Gilford Raid, M.D.     BB/MEDQ  D:  08/10/2011  T:  08/11/2011  Job:  [id number]  Electronically Signed by Gilford Raid M.D. on 08/20/2011 03:59:52 PM

## 2011-08-20 NOTE — Consult Note (Signed)
NAMEGRACELYNNE, [name]             ACCOUNT NO.:  [id number]  MEDICAL RECORD NO.:  [id number]  LOCATION:  [id number]                         FACILITY:  [facility]  PHYSICIAN:  Gilford Raid, M.D.     DATE OF BIRTH:  Dec 02, 1922  DATE OF CONSULTATION:  07/31/2011 DATE OF DISCHARGE:                                CONSULTATION   REFERRING PHYSICIAN:  [name], MD  REASON FOR CONSULTATION:  Critical aortic stenosis.  CLINICAL HISTORY:  I was asked by Dr. Aundra Dubin to evaluate [name] for consideration of aortic valve replacement.  She is an 77 year old independent woman with a history of hypertension and hyperlipidemia and a known heart murmur who reports developing chest tightness and shortness of breath with exertion a few weeks ago.  She said this happened during a period of particularly hot weather and she has had recurrent episodes since.  She was treated in [facility] for possible bronchitis and sinus infection with steroids and antibiotics without improvement.  On the night of July 27, 2011, she had chest discomfort and shortness of breath all night long and could not sleep. She was admitted on July 28, 2011.  Troponin was elevated at 0.63 with a CPK-MB of 5.1.  Electrocardiogram was without significant changes. Chest x-ray showed pulmonary edema and her BNP was elevated at 62,780. A 2-D bedside echocardiogram showed severe aortic stenosis with a calcified valve with an aortic valve area of 0.7 cm2.  Mean gradient was 38 and peak gradient was 57.  There was mild-to-moderate mitral regurgitation, mild tricuspid regurgitation, and moderate pulmonary hypertension.  PA systolic pressure estimated at 55 mmHg.  Ejection fraction was reduced to about 20%.  There was mild right ventricular dysfunction.  She was treated with nesiritide and diuresis with improvement.  She also had renal dysfunction with creatinine 1.90 on admission which peaked at about 2.0.  As her diuresis has  been decreased, her creatinine is reduced to 1.65 today.  REVIEW OF SYSTEMS:  GENERAL:  She denies any fever or chills.  She has had no recent weight changes.  Appetite has been good.  She does report fatigue.  EYES:  She has some vision loss.  ENT:  She does have some hearing loss and wears bilateral hearing aids.  She has a speech impediment due to cleft palate.  ENDOCRINE:  She denies diabetes and hypothyroidism.  CARDIOVASCULAR:  As above.  She denies palpitations. She did have some PND and orthopnea all night prior to admission.  She has had exertional dyspnea and chest discomfort.  She does report bilateral lower extremity edema.  RESPIRATORY:  She has had some cough and no sputum production.  GI:  She has had no nausea or vomiting.  She denies melena and bright red blood per rectum.  GU:  She denies dysuria and hematuria.  MUSCULOSKELETAL:  She does have occasional arthralgias. NEUROLOGICAL:  She denies any focal weakness or numbness.  She denies dizziness and syncope.  She has never had TIA or stroke.  ALLERGIES:  To MORPHINE, SULFA, and PENICILLIN.  PAST MEDICAL HISTORY:  Significant for hypertension and hyperlipidemia. She has history of a cleft palate as a child.  She has a  history of anemia and in the setting of erosive esophagitis.  She has a history of heart murmur.  She has a history of osteoporosis.  She is status post bilateral partial mastectomy.  She is status post right inguinal hernia repair x2.  Status post vaginal hysterectomy.  Status post cleft palate repair as a child.  MEDICATIONS:  As noted on her medicine reconciliation form in the chart. These were reviewed.  FAMILY HISTORY:  Her mother died at 44 with a stroke.  Her father died at 3 with heart disease.  She has a son who is here with her today.  SOCIAL HISTORY:  She lives in [place] alone.  Her daughter lives nearby.  She denies any history of tobacco or alcohol use.  PHYSICAL EXAMINATION:   VITAL SIGNS:  Blood pressure is 112/75, pulse is 108 and regular, respiratory rate is 16 and unlabored. GENERAL:  She is a well-developed elderly white female in no distress. HEENT:  Normocephalic and atraumatic.  Pupils are equal and reactive to light and accommodation.  Extraocular muscles are intact.  Oropharynx is clear. NECK:  Normal carotid pulses bilaterally.  There is transmitted murmur on both sides of her neck.  There is no JVD.  There is no adenopathy or thyromegaly. CARDIAC:  Regular rate and rhythm with a great 3/6 high-pitched honking systolic murmur heard throughout the precordium.  There is no diastolic murmur. LUNGS:  Crackles in her bases. ABDOMEN:  Active bowel sounds.  Abdomen is soft and nontender.  There are no palpable masses or organomegaly. EXTREMITIES:  No peripheral edema.  Pedal pulses are palpable bilaterally. SKIN:  Warm and dry. NEUROLOGIC:  Alert and oriented x3.  Motor and sensory exams were grossly normal.  LABORATORY DATA:  Normal electrolytes with BUN of 55 and creatinine 1.65 today.  White blood cell count is 7.3, hemoglobin 11.7, hematocrit 35.5, and platelet count 249,000.  Her liver function profile was normal on admission with an albumin of 3.5.  IMPRESSION:  [name] has critical aortic stenosis presenting with acute on chronic systolic heart failure with pulmonary edema.  She is clinically improved with diuresis.  I agree that aortic valve replacement is the best treatment for this patient.  She is at increased risk due to her age, left ventricular dysfunction, and renal dysfunction, but I think she is an acceptable risk patient for surgery and I would expect a good outcome for her.  I would not recommend transcatheter aortic valve replacement for this patient since she is an acceptable surgical candidate.  She will require cardiac catheterization and if her creatinine remains stable for a few days after that she could proceed with  surgery.  I would tentatively plan to do surgery on Monday, August 06, 2011 assuming that she can get cath in the next day or two and her creatinine remains stable.  I discussed all this with the patient and her son including alternatives, benefits, and risks including but not limited to bleeding, blood transfusion, infection, stroke, myocardial infarction, heart block requiring permanent pacemaker, organ dysfunction, and death.  She understands and agrees to proceed.     Gilford Raid, M.D.     BB/MEDQ  D:  07/31/2011  T:  08/01/2011  Job:  [id number]  cc:   Leonides Grills, M.D.  Electronically Signed by Gilford Raid M.D. on 08/20/2011 03:59:46 PM

## 2011-08-20 NOTE — Discharge Summary (Signed)
[name], [name]             ACCOUNT NO.:  [id number]  MEDICAL RECORD NO.:  [id number]  LOCATION:  2033                         FACILITY:  [facility]  PHYSICIAN:  Gilford Raid, M.D.     DATE OF BIRTH:  1922-06-27  DATE OF ADMISSION:  07/28/2011 DATE OF DISCHARGE:  08/16/2011                              DISCHARGE SUMMARY   PRIMARY ADMITTING DIAGNOSIS:  Shortness of breath.  ADDITIONAL/DISCHARGE DIAGNOSES: 1. Critical aortic stenosis. 2. Single-vessel coronary artery disease. 3. Acute exacerbation of congestive heart failure. 4. Hypertension. 5. Hyperlipidemia. 6. Postoperative atrial fibrillation. 7. History of anemia. 8. History of erosive esophagitis. 9. Osteoporosis. 10.History of cleft palate with repair as a child. 11.History of bilateral partial mastectomies. 12.Acute-on-chronic renal insufficiency with baseline creatinine on     admission 1.6. 13.Preoperative urinary tract infection. 14.Acute postoperative blood loss anemia.  PROCEDURES PERFORMED: 1. Cardiac catheterization. 2. Coronary artery bypass grafting x1 (left internal mammary artery to     the LAD). 3. Aortic valve replacement with 23-mm [facility] Ease pericardial     tissue valve.  HISTORY:  The patient is an 77 year old female with a known history of a heart murmur.  She presented to the emergency department at [facility] on the date of admission complaining of chest discomfort.  Over the previous several weeks, she developed exertional chest tightness and shortness of breath but this occurred during a period of particularly hot weather, and she did not feel that this was anything medically significant.  She ultimately presented to an urgent care for further evaluation and was treated for a possible sinus infection and bronchitis with antibiotics and steroids.  She continued to have chest discomfort and on the July 27, 2011, she developed chest discomfort with shortness of breath at rest, which  kept her awake all night long.  She was subsequently seen in the emergency department at [facility] on the date of this admission and was found to have acute on set congestive heart failure.  She was treated with IV Lasix and IV nitroglycerin and was seen by Cardiology.  She had a mild increase in her troponin, which was concerning for possible MI and was felt that she should be transferred to [facility] for further evaluation and for cardiac catheterization.  HOSPITAL COURSE:  The patient was admitted to [facility] on July 28, 2011.  She was continued on nitroglycerin and her chest discomfort resolved.  Chest x-ray showed pulmonary edema with an elevated BNP 62,780.  A 2-D echocardiogram was performed at bedside, which showed severe aortic stenosis with a calcified valve with an aortic valve area of 0.7 cm2.  Mean gradient was 38 and peak gradient was 57.  There was mild-to-moderate mitral regurgitation, mild tricuspid regurgitation and moderate pulmonary hypertension.  PA systolic pressure was estimated at 55 mmHg.  Ejection fraction was reduced at about 20%. There was mild right ventricular dysfunction.  She was seen in consultation by Dr. Gilford Raid for consideration of aortic valve replacement.  Dr. Cyndia Bent agreed that AVR was the best treatment for this patient, but it was felt that she would require cardiac catheterization prior to surgery to examine her coronary anatomy.  She  underwent cardiac catheterization on August 03, 2011, and was found to have focal 80% stenosis in the mid LAD with 40% stenosis of proximal right coronary artery and 50% small first obtuse marginal.  The aortic valve was not able to be crossed at the time of catheterization.  After review of her films, Dr. Cyndia Bent agreed with need for AVR with concomitant coronary artery bypass graft to the LAD.  The patient did develop an acute on chronic renal insufficiency with creatinine bumping up to  3.34 post cath and was felt that her surgery should be delayed in order to allow her creatinine to return to baseline.  She did remain stable in the interim with no further chest pain.  She underwent carotid Doppler studies, which showed no ICA stenosis and lower extremity ABIs, which were within normal limits bilaterally.  She was taken to the operating room on August 10, 2011, and underwent AVR, CABG as described above.  Please see previously dictated operative report for complete details of surgery. She tolerated the procedure well and was transferred to the SICU in stable condition.  She was extubated shortly after surgery.  She was hemodynamically stable and doing well on postop day #1.  At that time, her chest tubes and hemodynamic monitoring lines were removed.  She remained stable but remained in the unit for close observation of her renal insufficiency.  Her creatinine did remain stable, and she was started on a gentle diuretic for postoperative volume overload.  She developed atrial fibrillation with heart rates in the 140s and was given a bolus of amiodarone, which did convert to normal sinus rhythm.  She was subsequently started on p.o. amiodarone and the dosage of her beta- blocker was titrated upward.  She initially had some nausea with the amiodarone, but her dosage was decreased and her nausea resolved.  She has remained in sinus rhythm and overall remained stable and by postop day #4 was ready for transfer to the step-down unit.  Otherwise, her postoperative course has been uneventful.  She has had a mild acute blood loss anemia, which is not required transfusion.  She is diuresing well and is back down to her preoperative weight with only minimal lower extremity edema on physical exam.  Her incisions are healing well.  She has remained in sinus rhythm.  Her other vital signs have been stable. She has been weaned from supplemental oxygen and is maintaining sats of greater  than 90% on room air.  Her most recent labs show sodium 145, potassium 3.5 which has been supplemented, BUN 32, creatinine 1.6. Hemoglobin 8.8, hematocrit 27, white count 10.5, platelets 155.  Her most recent chest x-ray shows small bilateral effusions and bibasilar atelectasis.  She is ambulating in the halls with cardiac rehab phase I and is progressing well with mobility.  It is felt that since she has remained stable and is otherwise progressing well.  She is ready for discharge home at this time.  DISCHARGE MEDICATIONS: 1. Amiodarone 400 mg b.i.d. x 14 days then 200 mg b.i.d. 2. Crestor 40 mg at nighttime. 3. Ferrous gluconate 324 mg b.i.d. 4. Lasix 40 mg daily x5 days. 5. Metoprolol 25 mg b.i.d. 6. Potassium 20 mEq daily x5 days. 7. Ultram 50-100 mg q.4 h p.r.n. pain. 8. Enteric-coated aspirin 325 mg daily. 9. Benadryl 25 mg at nighttime p.r.n. 10.Calcium carbonate 600 mg daily. 11.Omeprazole 20 mg daily. 12.Tylenol 325 mg 2 tablets daily p.r.n.  DISCHARGE INSTRUCTIONS:  She is asked to refrain from  driving, heavy lifting or strenuous activity.  She may continue ambulating daily and using her incentive spirometer.  She may shower daily and clean incisions with soap and water.  She may continue a low-fat, low-sodium diet.  DISCHARGE FOLLOWUP:  She will need to make an appointment to see Dr. Aundra Dubin in 2 weeks.  She will follow up with Dr. Cyndia Bent in 3 weeks with a chest x-ray.  In the interim if she experiences any problems or has questions, she is to contact our office immediately.  Case manager has assisted the patient and family with discharge planning and home health nurse has been set up for postoperative restorative care.     Suzzanne Cloud, P.A.   ______________________________ Gilford Raid, M.D.    GC/MEDQ  D:  08/16/2011  T:  08/16/2011  Job:  [id number]  cc:   [name], MD Jeryl Columbia, M.D.  Electronically Signed by Micheline Maze. on 08/17/2011  03:45:41 PM Electronically Signed by Gilford Raid M.D. on 08/20/2011 03:59:56 PM

## 2011-08-27 ENCOUNTER — Inpatient Hospital Stay (HOSPITAL_COMMUNITY): Payer: Medicare Other

## 2011-08-27 ENCOUNTER — Emergency Department (HOSPITAL_COMMUNITY)
Admission: EM | Admit: 2011-08-27 | Discharge: 2011-08-27 | Disposition: A | Discharge status: Other Acute Inpatient Hospital | Payer: Medicare Other | Source: Home / Self Care | Attending: Emergency Medicine | Admitting: Emergency Medicine

## 2011-08-27 ENCOUNTER — Other Ambulatory Visit: Payer: Self-pay

## 2011-08-27 ENCOUNTER — Telehealth: Payer: Self-pay | Admitting: Nurse Practitioner

## 2011-08-27 ENCOUNTER — Encounter (HOSPITAL_COMMUNITY): Payer: Self-pay

## 2011-08-27 ENCOUNTER — Emergency Department (HOSPITAL_COMMUNITY): Payer: Medicare Other

## 2011-08-27 ENCOUNTER — Inpatient Hospital Stay (HOSPITAL_COMMUNITY)
Admission: AD | Admit: 2011-08-27 | Discharge: 2011-08-29 | DRG: 292 | Disposition: A | Discharge status: Home / Self Care | Payer: Medicare Other | Source: Other Acute Inpatient Hospital | Attending: Cardiology | Admitting: Cardiology

## 2011-08-27 DIAGNOSIS — E785 Hyperlipidemia, unspecified: Secondary | ICD-10-CM | POA: Diagnosis present

## 2011-08-27 DIAGNOSIS — Z951 Presence of aortocoronary bypass graft: Secondary | ICD-10-CM

## 2011-08-27 DIAGNOSIS — I359 Nonrheumatic aortic valve disorder, unspecified: Secondary | ICD-10-CM | POA: Diagnosis present

## 2011-08-27 DIAGNOSIS — I4891 Unspecified atrial fibrillation: Secondary | ICD-10-CM | POA: Diagnosis present

## 2011-08-27 DIAGNOSIS — I428 Other cardiomyopathies: Secondary | ICD-10-CM | POA: Diagnosis present

## 2011-08-27 DIAGNOSIS — R05 Cough: Secondary | ICD-10-CM | POA: Insufficient documentation

## 2011-08-27 DIAGNOSIS — J90 Pleural effusion, not elsewhere classified: Secondary | ICD-10-CM | POA: Diagnosis present

## 2011-08-27 DIAGNOSIS — I5023 Acute on chronic systolic (congestive) heart failure: Principal | ICD-10-CM | POA: Diagnosis present

## 2011-08-27 DIAGNOSIS — R0602 Shortness of breath: Secondary | ICD-10-CM

## 2011-08-27 DIAGNOSIS — M810 Age-related osteoporosis without current pathological fracture: Secondary | ICD-10-CM | POA: Diagnosis present

## 2011-08-27 DIAGNOSIS — I10 Essential (primary) hypertension: Secondary | ICD-10-CM | POA: Insufficient documentation

## 2011-08-27 DIAGNOSIS — I509 Heart failure, unspecified: Secondary | ICD-10-CM | POA: Diagnosis present

## 2011-08-27 DIAGNOSIS — IMO0001 Reserved for inherently not codable concepts without codable children: Secondary | ICD-10-CM

## 2011-08-27 DIAGNOSIS — I129 Hypertensive chronic kidney disease with stage 1 through stage 4 chronic kidney disease, or unspecified chronic kidney disease: Secondary | ICD-10-CM | POA: Diagnosis present

## 2011-08-27 DIAGNOSIS — N184 Chronic kidney disease, stage 4 (severe): Secondary | ICD-10-CM | POA: Diagnosis present

## 2011-08-27 DIAGNOSIS — Z7982 Long term (current) use of aspirin: Secondary | ICD-10-CM

## 2011-08-27 DIAGNOSIS — I2510 Atherosclerotic heart disease of native coronary artery without angina pectoris: Secondary | ICD-10-CM | POA: Diagnosis present

## 2011-08-27 DIAGNOSIS — R079 Chest pain, unspecified: Secondary | ICD-10-CM | POA: Insufficient documentation

## 2011-08-27 DIAGNOSIS — K219 Gastro-esophageal reflux disease without esophagitis: Secondary | ICD-10-CM | POA: Insufficient documentation

## 2011-08-27 DIAGNOSIS — Z954 Presence of other heart-valve replacement: Secondary | ICD-10-CM | POA: Insufficient documentation

## 2011-08-27 DIAGNOSIS — D649 Anemia, unspecified: Secondary | ICD-10-CM | POA: Diagnosis present

## 2011-08-27 DIAGNOSIS — R059 Cough, unspecified: Secondary | ICD-10-CM | POA: Insufficient documentation

## 2011-08-27 DIAGNOSIS — Z79899 Other long term (current) drug therapy: Secondary | ICD-10-CM | POA: Insufficient documentation

## 2011-08-27 HISTORY — DX: Heart failure, unspecified: I50.9

## 2011-08-27 LAB — CARDIAC PANEL(CRET KIN+CKTOT+MB+TROPI): Total CK: 48 U/L (ref 7–177)

## 2011-08-27 LAB — BASIC METABOLIC PANEL
Calcium: 9.8 mg/dL (ref 8.4–10.5)
Creatinine, Ser: 1.81 mg/dL — ABNORMAL HIGH (ref 0.50–1.10)
GFR calc Af Amer: 32 mL/min — ABNORMAL LOW (ref >60)
GFR calc non Af Amer: 27 mL/min — ABNORMAL LOW (ref >60)

## 2011-08-27 LAB — CBC
MCH: 29.5 pg (ref 26.0–34.0)
MCV: 97.4 fL (ref 78.0–100.0)
Platelets: 366 10*3/uL (ref 150–400)
RDW: 14.5 % (ref 11.5–15.5)

## 2011-08-27 LAB — HEPATIC FUNCTION PANEL
ALT: 29 U/L (ref 0–35)
AST: 35 U/L (ref 0–37)
Bilirubin, Direct: 0.1 mg/dL (ref 0.0–0.3)
Total Bilirubin: 0.2 mg/dL — ABNORMAL LOW (ref 0.3–1.2)

## 2011-08-27 NOTE — ED Notes (Signed)
Sob for 7-8 days

## 2011-08-27 NOTE — ED Provider Notes (Signed)
History   Chart scribed for Mervin Kung, MD by Caryl Bis; the patient was seen in room [facility]; this patient's care was started at 7:40 AM.    CSN: [id number] Arrival date & time: 08/27/2011  6:48 AM  Chief Complaint  Patient presents with  . Shortness of Breath    x 7-8 days   HPI [name] is a 77 y.o. female who presents to the Emergency Department complaining of sob. Pt s/p CABG x 1 and aortic valve replacement August 10, has had persistent sob since then. Pt was d/c August 16th and on lasix until the 22nd (5 days ago). Family reports sob became worse again 2 days ago and has been constant since with associated mild feet swelling that improved with elevation. Family called cardiologist yesterday and recommended lasix dose which helped sob briefly; O2 sats upper 80s yesterday per home health nurse. Also c/o cough and chest soreness s/p surgery. Denies n/v, abd pain, or f/c.   PCP Dr. Orson Ape Pulmonology Dr. Jannett Celestine Cardiology  Past Medical History  Diagnosis Date  . Hypertension   . Anemia   . GERD (gastroesophageal reflux disease)     Past Surgical History  Procedure Date  . Hernia repair   . Abdominal hysterectomy   . Tonsillectomy   . Cataract extraction   . Cystostomy     Family History  Problem Relation Age of Onset  . Heart failure Father     History  Substance Use Topics  . Smoking status: Never Smoker   . Smokeless tobacco: Never Used  . Alcohol Use: No    OB History    Grav Para Term Preterm Abortions TAB SAB Ect Mult Living   3 3 3       3      Previous Medications   ACETAMINOPHEN (TYLENOL) 325 MG TABLET    Take 650 mg by mouth every 6 (six) hours as needed. For pain    AMIODARONE (PACERONE) 400 MG TABLET    Take 400 mg by mouth 2 (two) times daily. Until 8/30 then 200 mg twice a day   ASPIRIN EC 325 MG TABLET    Take 325 mg by mouth daily.     ASPIRIN EC 81 MG TABLET    Take 325 mg by mouth daily.    AZITHROMYCIN  (ZITHROMAX) 250 MG TABLET    Take 250-500 mg by mouth daily. Patient takes 500 mg on day 1 and 250 mg on days 2-5   CALCIUM CARBONATE (CALTRATE 600 PO)    Take 1 tablet by mouth daily.     CRANBERRY 250 MG CAPS    Take 250 mg by mouth daily.     DIPHENHYDRAMINE (BENADRYL) 25 MG TABLET    Take 25 mg by mouth every 6 (six) hours as needed. For sleep   FERROUS GLUCONATE (FERGON) 324 MG TABLET    Take 324 mg by mouth 2 (two) times daily.     FUROSEMIDE (LASIX) 40 MG TABLET    Take 40 mg by mouth daily.     HYDROCHLOROTHIAZIDE 25 MG TABLET    Take 25 mg by mouth daily.     METOPROLOL SUCCINATE (TOPROL-XL) 25 MG 24 HR TABLET    Take 25 mg by mouth 2 (two) times daily.     METOPROLOL TARTRATE (LOPRESSOR) 25 MG TABLET    Take 25 mg by mouth 2 (two) times daily.     OMEPRAZOLE (PRILOSEC) 20 MG CAPSULE    Take 20  mg by mouth daily.     POTASSIUM CHLORIDE SA (K-DUR,KLOR-CON) 20 MEQ TABLET    Take 20 mEq by mouth daily.     PREDNISONE (DELTASONE) 5 MG TABLET    Take 5 mg by mouth daily. x6 days since 07/25/11    RAMIPRIL (ALTACE) 10 MG TABLET    Take 10 mg by mouth daily.     ROSUVASTATIN (CRESTOR) 40 MG TABLET    Take 40 mg by mouth at bedtime.     TRAMADOL (ULTRAM) 50 MG TABLET    Take 50 mg by mouth every 4 (four) hours as needed. For pain     Allergies as of 08/27/2011 - Review Complete 08/27/2011  Allergen Reaction Noted  . Fish allergy Shortness Of Breath and Swelling 08/27/2011  . Cheese Other (See Comments) 08/27/2011  . Morphine and related Nausea And Vomiting 07/28/2011  . Penicillins Other (See Comments) 07/28/2011  . Sulfa antibiotics Other (See Comments) 07/28/2011       Review of Systems  Constitutional: Negative for fever and chills.  HENT: Negative for congestion and rhinorrhea.   Eyes: Negative for redness.  Respiratory: Positive for cough and shortness of breath. Negative for wheezing.   Cardiovascular: Positive for leg swelling. Negative for chest pain.  Gastrointestinal:  Negative for nausea, vomiting, abdominal pain and diarrhea.  Genitourinary: Negative for flank pain.  Musculoskeletal: Negative for back pain.       Chest wall soreness s/p CABG  Skin: Negative for rash.  Neurological: Negative for headaches.    Physical Exam  BP 159/72  Pulse 61  Temp(Src) 97.6 F (36.4 C) (Oral)  Resp 22  Ht 5\' 3"  (1.6 m)  Wt 119 lb (53.978 kg)  BMI 21.08 kg/m2  SpO2 99%  Physical Exam  Nursing note and vitals reviewed. Constitutional: She is oriented to person, place, and time. She appears well-developed and well-nourished. No distress.  HENT:  Head: Normocephalic and atraumatic.  Eyes: Conjunctivae and EOM are normal. Pupils are equal, round, and reactive to light.  Neck: Normal range of motion. Neck supple.  Cardiovascular: Normal rate, regular rhythm and normal heart sounds.   Pulmonary/Chest: Effort normal and breath sounds normal. She has no wheezes. She has no rales.       Breath sounds decreased bilaterally; midline incision well healing, no sign of infection  Abdominal: Soft. Bowel sounds are normal.  Musculoskeletal: Normal range of motion. She exhibits no edema and no tenderness.  Lymphadenopathy:    She has no cervical adenopathy.  Neurological: She is alert and oriented to person, place, and time. She has normal strength. No cranial nerve deficit or sensory deficit.  Skin: Skin is warm and dry. No rash noted.  Psychiatric: She has a normal mood and affect.    ED Course  Procedures  OTHER DATA REVIEWED: Nursing notes and vital signs reviewed. Prior records reviewed.   DIAGNOSTIC STUDIES: Oxygen Saturation is 97% on 2 liters/min via Patient connected to nasal cannula oxygen, normal by my Interpretation.  EKG:   Date: 08/27/2011  Rate:57  Rhythm: sinus bradycardia  QRS Axis: normal  Intervals: normal  ST/T Wave abnormalities: nonspecific T wave changes  Conduction Disutrbances:right bundle branch block  Narrative Interpretation:    Old EKG Reviewed: unchanged  RBBB INCOMPLETE Cardiac Monitor: 08/27/2011 7:45 AM Sinus, rate 60, no ectopy   LABS / RADIOLOGY: Results for orders placed during the hospital encounter of 08/27/11  CBC      Component Value Range   WBC 10.2  4.0 - 10.5 (K/uL)   RBC 3.02 (*) 3.87 - 5.11 (MIL/uL)   Hemoglobin 8.9 (*) 12.0 - 15.0 (g/dL)   HCT 29.4 (*) 36.0 - 46.0 (%)   MCV 97.4  78.0 - 100.0 (fL)   MCH 29.5  26.0 - 34.0 (pg)   MCHC 30.3  30.0 - 36.0 (g/dL)   RDW 14.5  11.5 - 15.5 (%)   Platelets 366  150 - 400 (K/uL)  BASIC METABOLIC PANEL      Component Value Range   Sodium 143  135 - 145 (mEq/L)   Potassium 4.0  3.5 - 5.1 (mEq/L)   Chloride 103  96 - 112 (mEq/L)   CO2 32  19 - 32 (mEq/L)   Glucose, Bld 99  70 - 99 (mg/dL)   BUN 30 (*) 6 - 23 (mg/dL)   Creatinine, Ser 1.81 (*) 0.50 - 1.10 (mg/dL)   Calcium 9.8  8.4 - 10.5 (mg/dL)   GFR calc non Af Amer 27 (*) >60 (mL/min)   GFR calc Af Amer 32 (*) >60 (mL/min)  CARDIAC PANEL(CRET KIN+CKTOT+MB+TROPI)      Component Value Range   Total CK 48  7 - 177 (U/L)   CK, MB 3.3  0.3 - 4.0 (ng/mL)   Troponin I <0.30  <0.30 (ng/mL)   Relative Index RELATIVE INDEX IS INVALID  0.0 - 2.5   PRO B NATRIURETIC PEPTIDE      Component Value Range   BNP, POC 40554.0 (*) 0 - 450 (pg/mL)  HEPATIC FUNCTION PANEL      Component Value Range   Total Protein 6.0  6.0 - 8.3 (g/dL)   Albumin 3.1 (*) 3.5 - 5.2 (g/dL)   AST 35  0 - 37 (U/L)   ALT 29  0 - 35 (U/L)   Alkaline Phosphatase 159 (*) 39 - 117 (U/L)   Total Bilirubin 0.2 (*) 0.3 - 1.2 (mg/dL)   Bilirubin, Direct 0.1  0.0 - 0.3 (mg/dL)   Indirect Bilirubin 0.1 (*) 0.3 - 0.9 (mg/dL)   Dg Chest 2 View  08/27/2011  *RADIOLOGY REPORT*  Clinical Data: Shortness of breath, hypertension, post CABG and AVR on 08/10/2011  CHEST - 2 VIEW  Comparison: 08/15/2011  Findings: Enlargement of cardiac silhouette with note of an aortic valve. Atherosclerotic calcification aorta. Slight pulmonary vascular  congestion. Bibasilar effusions and atelectasis, greater on the left. No definite pulmonary edema or segmental consolidation. Bones demineralized.  IMPRESSION: Enlargement of cardiac silhouette post AVR. Bibasilar effusions and atelectasis, greater on left.  Original Report Authenticated By: Burnetta Sabin, M.D.     ED COURSE:  10:39 PM Pt resting comfortably, no new complaints; O2 sat 98% on 2L  12:00 PM Case discussed with cardiothoracic surgery who recommend admission at either [facility] or [facility].  12:30 PM Case discussed with hospitalist Dr. Conley Canal who requests transfer to [facility].  12:35 PM Case discussed with cardiologist at [facility] Dr. [place] who will accept pt.  12:38 PM Pt resting comfortably; discussed plan for transfer and admission at [facility], family and pt understand and agree   MDM: WORSENING PLEURAL EFFUSION, OXYGEN  REQUIREMENT S/P CABG AND AORTIC VALVE REPLACEMENT. [facility] [place] CARDS, DR HENDRICKSON CT SURGERY AND HOSPITALIST HERE. [facility] IF ADMITTED AT [facility] CARDS ACCEPTING VIA [facility]. NOT CHF NO CHANGE IN EKG NEGATIVE   IMPRESSION: 1. Shortness of breath dyspnea      PLAN: Transfer to [facility] All results reviewed and discussed with pt, questions answered, pt agreeable with plan.   CONDITION  ON DISCHARGE: stable   SCRIBE ATTESTATION: I personally performed the services described in this documentation, which was scribed in my presence. The recorded information has been reviewed and considered. Mervin Kung, MD      Mervin Kung, MD 08/27/11 1340

## 2011-08-27 NOTE — Telephone Encounter (Signed)
Ms. [name] dtr, [name], called this am stating that [name] awoke sob again this am.  She had a similar episode over the weekend and Lasix was called in for her. Her weight has been stable but her dyspnea has progressed and her dtr gave her another Lasix this am.  I advised that in light of stable weight, we cannot be sure that this represents chf, esp. In light of her recent avr/cabg w/ sm. Effusions on cxr prior to d/c.  I rec eval @ [facility] (local to her) given recurrence of dyspnea.  Dtr plans to bring pt to er this am.

## 2011-08-27 NOTE — ED Notes (Signed)
Assisted patient to use bedside commode.

## 2011-08-28 ENCOUNTER — Inpatient Hospital Stay (HOSPITAL_COMMUNITY): Payer: Medicare Other

## 2011-08-28 DIAGNOSIS — I059 Rheumatic mitral valve disease, unspecified: Secondary | ICD-10-CM

## 2011-08-28 DIAGNOSIS — I5023 Acute on chronic systolic (congestive) heart failure: Secondary | ICD-10-CM

## 2011-08-28 LAB — BASIC METABOLIC PANEL
Chloride: 102 mEq/L (ref 96–112)
GFR calc Af Amer: 33 mL/min — ABNORMAL LOW (ref >60)
GFR calc non Af Amer: 28 mL/min — ABNORMAL LOW (ref >60)
Glucose, Bld: 79 mg/dL (ref 70–99)
Potassium: 3.3 mEq/L — ABNORMAL LOW (ref 3.5–5.1)
Sodium: 146 mEq/L — ABNORMAL HIGH (ref 135–145)

## 2011-08-28 LAB — CBC
Hemoglobin: 9.8 g/dL — ABNORMAL LOW (ref 12.0–15.0)
MCHC: 31.2 g/dL (ref 30.0–36.0)
WBC: 8.9 10*3/uL (ref 4.0–10.5)

## 2011-08-28 LAB — TSH: TSH: 3.703 u[IU]/mL (ref 0.350–4.500)

## 2011-08-28 LAB — PROTEIN, BODY FLUID: Total protein, fluid: 2.9 g/dL

## 2011-08-28 LAB — BODY FLUID CELL COUNT WITH DIFFERENTIAL
Monocyte-Macrophage-Serous Fluid: 17 % — ABNORMAL LOW (ref 50–90)
Neutrophil Count, Fluid: 40 % — ABNORMAL HIGH (ref 0–25)
Total Nucleated Cell Count, Fluid: 1163 cu mm — ABNORMAL HIGH (ref 0–1000)

## 2011-08-28 LAB — LACTATE DEHYDROGENASE, PLEURAL OR PERITONEAL FLUID

## 2011-08-29 ENCOUNTER — Encounter: Payer: Self-pay | Admitting: Physician Assistant

## 2011-08-29 ENCOUNTER — Encounter: Payer: Self-pay | Admitting: *Deleted

## 2011-08-29 DIAGNOSIS — I5033 Acute on chronic diastolic (congestive) heart failure: Secondary | ICD-10-CM

## 2011-08-29 LAB — BASIC METABOLIC PANEL
Chloride: 103 mEq/L (ref 96–112)
GFR calc Af Amer: 28 mL/min — ABNORMAL LOW (ref >60)
Potassium: 3.3 mEq/L — ABNORMAL LOW (ref 3.5–5.1)

## 2011-08-29 LAB — CBC
Platelets: 286 10*3/uL (ref 150–400)
RBC: 3.09 MIL/uL — ABNORMAL LOW (ref 3.87–5.11)
RDW: 14.6 % (ref 11.5–15.5)
WBC: 6.8 10*3/uL (ref 4.0–10.5)

## 2011-08-29 LAB — PATHOLOGIST SMEAR REVIEW: Tech Review: REACTIVE

## 2011-08-30 ENCOUNTER — Encounter: Payer: Medicare Other | Admitting: Physician Assistant

## 2011-09-03 ENCOUNTER — Telehealth: Payer: Self-pay | Admitting: Physician Assistant

## 2011-09-03 ENCOUNTER — Telehealth: Payer: Self-pay | Admitting: Cardiothoracic Surgery

## 2011-09-03 NOTE — Telephone Encounter (Signed)
Please call her and set up followup.

## 2011-09-03 NOTE — Telephone Encounter (Signed)
Returned call from pt's daughter saying patient pt has had rash on her rt arm and torso for 24 hours.  The rash has not changed, it does not itch, it is not raised or warm to touch.  She denies SOB or difficulty swallowing.  The patient was discharged from the hospital 4 days ago where the only medication changes have been addition of lipitor (changed from crestor) and cardizem (changed from lopressor).  After discussion with Dr. Haroldine Laws, we will hold the lipitor at this time and have patient follow up with DM within 48 hours.  The pt's daughter appreciated the call back.  I have instructed her that the pt can take benadryl as needed if the area begins to itch.  Please call the daughter back at [phone].

## 2011-09-03 NOTE — Telephone Encounter (Signed)
error 

## 2011-09-04 ENCOUNTER — Telehealth: Payer: Self-pay | Admitting: Cardiology

## 2011-09-04 NOTE — Telephone Encounter (Signed)
Pt to see PCP today.

## 2011-09-04 NOTE — Telephone Encounter (Signed)
Pt had a reaction to medication and was told yesterday by Lexine Baton the PA to stop lipitor per daughters call this am rash is worse and needs to know what to do

## 2011-09-04 NOTE — Telephone Encounter (Signed)
Alt phone number= [phone] (cell)

## 2011-09-04 NOTE — Telephone Encounter (Signed)
Mrs Maciolek states that she developed a rash that they think is from the Lipitor (started last Wed).  They stopped the lipitor.  Her last dose was Sunday pm.  The rash is on her face, back, elbows, arms and trunk.  The rash is red and flat.  It is pin sized but does run together to make large blotches.  It does not itch.  No elevated temp.  The [facility] nurse thought it was a medication reaction and was told to stop the lipitor by [facility].  Her BP med was changed from Metoprolol to Carvedilol last Wed.  No trouble with breathing or swelling.  No weight gain.

## 2011-09-04 NOTE — Telephone Encounter (Signed)
See phone note 09/04/11. Pt to see her PCP today

## 2011-09-04 NOTE — Telephone Encounter (Signed)
Per Dr Aundra Dubin, pt to see pcp.  Pt agrees.

## 2011-09-07 ENCOUNTER — Other Ambulatory Visit (HOSPITAL_COMMUNITY): Payer: Medicare Other

## 2011-09-07 ENCOUNTER — Other Ambulatory Visit: Payer: Self-pay | Admitting: Thoracic Surgery

## 2011-09-07 ENCOUNTER — Ambulatory Visit (INDEPENDENT_AMBULATORY_CARE_PROVIDER_SITE_OTHER): Payer: Medicare Other | Admitting: *Deleted

## 2011-09-07 DIAGNOSIS — I359 Nonrheumatic aortic valve disorder, unspecified: Secondary | ICD-10-CM

## 2011-09-07 DIAGNOSIS — N185 Chronic kidney disease, stage 5: Secondary | ICD-10-CM

## 2011-09-07 DIAGNOSIS — I2510 Atherosclerotic heart disease of native coronary artery without angina pectoris: Secondary | ICD-10-CM

## 2011-09-07 LAB — BASIC METABOLIC PANEL
Chloride: 106 mEq/L (ref 96–112)
Creatinine, Ser: 2.1 mg/dL — ABNORMAL HIGH (ref 0.4–1.2)
Potassium: 4.3 mEq/L (ref 3.5–5.1)
Sodium: 143 mEq/L (ref 135–145)

## 2011-09-08 ENCOUNTER — Other Ambulatory Visit: Payer: Self-pay | Admitting: Adult Health

## 2011-09-09 NOTE — ED Provider Notes (Signed)
I personally performed the services described in this documentation, which was scribed in my presence. The recorded information has been reviewed and considered. No att. providers found   Orlie Dakin, MD 09/09/11 [phone]

## 2011-09-10 DIAGNOSIS — I10 Essential (primary) hypertension: Secondary | ICD-10-CM | POA: Insufficient documentation

## 2011-09-10 DIAGNOSIS — I509 Heart failure, unspecified: Secondary | ICD-10-CM | POA: Insufficient documentation

## 2011-09-10 DIAGNOSIS — I2510 Atherosclerotic heart disease of native coronary artery without angina pectoris: Secondary | ICD-10-CM | POA: Insufficient documentation

## 2011-09-10 DIAGNOSIS — D649 Anemia, unspecified: Secondary | ICD-10-CM

## 2011-09-10 DIAGNOSIS — I350 Nonrheumatic aortic (valve) stenosis: Secondary | ICD-10-CM

## 2011-09-10 DIAGNOSIS — K219 Gastro-esophageal reflux disease without esophagitis: Secondary | ICD-10-CM | POA: Insufficient documentation

## 2011-09-10 NOTE — Discharge Summary (Addendum)
  [name], POLCYN             ACCOUNT NO.:  [id number]  MEDICAL RECORD NO.:  [id number]  LOCATION:  2003                         FACILITY:  [facility]  PHYSICIAN:  [name], MD      DATE OF BIRTH:  07-30-1922  DATE OF ADMISSION:  08/27/2011 DATE OF DISCHARGE:  08/29/2011                              DISCHARGE SUMMARY   ADDENDUM  A 2-D echocardiogram demonstrated that wall thickness was increased in a pattern of mild LVH.  EF was in the range of 45-50%.  Mild hypokinesis of the basal mid anteroseptal myocardium.  Grade 1 diastolic dysfunction.  Aortic valve prosthesis was present and functioning normally with a normal range of motion.  Sewing ring appeared normal, had no rocky motion and showed no evidence of dehiscence.  Mild mitral regurgitation.  Aortic valve area was 1.43 cm2 VTI and 1.48 cm2 Vmax.  DISCHARGE MEDICATIONS: 1. Lipitor 80 mg nightly. 2. Coreg 6.25 mg b.i.d. 3. Amiodarone 200 mg daily. 4. Aspirin 325 mg daily. 5. Benadryl 25 mg nightly p.r.n. allergies. 6. Calcium carbonate 600 mg daily. 7. Ferrous gluconate 324 mg b.i.d. 8. Lasix 40 mg daily. 9. Omeprazole 20 mg daily. 10.Potassium chloride 20 mEq daily. 11.Tylenol 325 mg 2 tablets daily as needed for pain. 12.Tramadol 50 mg 1-2 tablets q.4 h p.r.n. pain.  DISPOSITION:  Ms. Hickenbottom will be discharged in stable condition to home.  She is to increase activity slowly and follow a low-sodium, heart- healthy diet.  She should keep her procedure site clean and dry.  She will have a BMET at Dr. Claris Gladden office on September 07, 2011, at 10:40 a.m. given her renal insufficiency.  She will follow up with Richardson Dopp, PA-C at Dr. Claris Gladden office on September 11 at 3:30 p.m. She originally was supposed to have an appointment tomorrow, but this was cancelled and moved back secondary to her admission.  DURATION OF DISCHARGE ENCOUNTER:  Greater than 30 minutes including physician and PA time.     Melina Copa, P.A.C.   ______________________________ [name], MD    DD/MEDQ  D:  08/29/2011  T:  08/29/2011  Job:  [id number]  Electronically Signed by Melina Copa  on 09/10/2011 06:32:36 PM Electronically Signed by [name] MD on 09/15/2011 11:05:05 PM

## 2011-09-10 NOTE — Discharge Summary (Addendum)
[name], [name]             ACCOUNT NO.:  [id number]  MEDICAL RECORD NO.:  [id number]  LOCATION:  2003                         FACILITY:  [facility]  PHYSICIAN:  [name], MD      DATE OF BIRTH:  Jul 20, 1922  DATE OF ADMISSION:  08/27/2011 DATE OF DISCHARGE:  08/29/2011                              DISCHARGE SUMMARY   DISCHARGE DIAGNOSES: 1. Left pleural effusion status post ultrasound-guided thoracentesis     August 20, 2011. 2. Acute-on-chronic diastolic congestive heart failure.     a.     History of cardiomyopathy, felt secondary to severe aortic      stenosis with coronary artery disease out of proportion to      cardiomyopathy, with preop echo in August 2012 demonstrating an      ejection fraction of 20%.     b.     Improved ejection fraction by echo this admission August 28, 2011, of 45% to 50%. 3. Severe aortic stenosis status post aortic valve replacement with a     #23 Edwards pericardial valve prosthetic valve on August 11, 2011. 4. Coronary artery disease status post left internal mammary artery to     the left anterior descending at the time of aortic valve     replacement on August 11, 2011. 5. Postoperative atrial fibrillation, August 2012 after coronary     artery bypass grafting/AVR, maintained on amiodarone. 6. Chronic kidney disease, stage IV with discharge creatinine of 2.05. 7. Anemia. 8. Hypertension. 9. Hyperlipidemia. 10.Erosive esophagitis. 11.Status post cleft palate repair. 12.Osteoporosis. 13.Bilateral partial mastectomies. 14.History of hysterectomy.  HOSPITAL COURSE:  [name] is an 77 year old lady who is recently status post aortic valve replacement as well as CAD status post LIMA on August 11, 2011, who presented to [facility] complaining of shortness of breath.  She was discharged on August 16, 2011, and had 5 days of Lasix at discharge and then it was stopped.  Since that time, she reported steadily increasing  exertional dyspnea with eventual shortness of breath even with mild activity.  She reported orthopnea as well.  Lasix was restarted the day prior to admission with some relief, but oxygen saturations were in the upper 80s on room air.  When home health came to see her, she was sent to the [facility].  There chest x-ray demonstrated a large left-sided pleural effusion and small right- sided pleural effusion.  She was transferred to [facility] for further evaluation.  Dr. Aundra Dubin suspected that her pleural effusion was likely secondary to her surgery.  He also suspected that there was a component of acute heart failure.  This was initially felt to be related to systolic heart failure given her prior low known EF.  However, echocardiogram this admission demonstrated an improved EF post surgery. Lopressor was changed to Coreg and the patient did well on this medication.  She was gently diuresed with Lasix 40 mg IV b.i.d., which was changed to p.o. today.  [name] was also seen in consultation by CVTS who also recommended ultrasound-guided thoracentesis.  She underwent this procedure yielding 575 mL of pleural fluid from the left on August 28, 2011.  Given her improved EF, hydralazine and Isordil were discontinued.  The patient's oxygen saturations were 96 on room air on day of discharge.  Dr. Aundra Dubin has seen and examined her today and feels she is stable for discharge.  DISCHARGE LABS:  Sodium 146, potassium 3.3, chloride 103, CO2 of 30, glucose 94, BUN 39, creatinine 2.05.  Please note that her potassium was repleted prior to discharge.  ProBNP was 4554 at [facility].  Enzymes were negative x1.  TSH 3.703.  WBC 6.8, hemoglobin 9.1, hematocrit 28.5, platelet count 286.  STUDIES: 1. Ultrasound-guided thoracentesis August 28, 2011, showed a yield of     575 mL of blood-tinged fluids. 2. Chest x-ray August 28, 2011, showed decrease in left-sided pleural     effusion.  No  postprocedural pneumothorax. 3. Right decubitus plain film showed minimal small right pleural     effusion, which does not appear to be freely layering. 4. Left decubitus plain film showed the left-sided pleural effusions     partially freely laid, although there may also be a loculated     consult. 5. 2-D echocardiogram May 28, 2011, demonstrated LV  Dictation Ends Here.     Melina Copa, P.A.C.   ______________________________ [name], MD    DD/MEDQ  D:  08/29/2011  T:  08/29/2011  Job:  [id number]  cc:   Leonides Grills, M.D. Gilford Raid, M.D.  Electronically Signed by Melina Copa  on 09/10/2011 06:32:33 PM Electronically Signed by [name] MD on 09/15/2011 11:04:59 PM

## 2011-09-11 ENCOUNTER — Encounter: Payer: Medicare Other | Admitting: Surgery

## 2011-09-11 ENCOUNTER — Ambulatory Visit
Admission: RE | Admit: 2011-09-11 | Discharge: 2011-09-11 | Disposition: A | Discharge status: Home / Self Care | Payer: Medicare Other | Source: Ambulatory Visit | Attending: Thoracic Surgery | Admitting: Thoracic Surgery

## 2011-09-11 ENCOUNTER — Encounter: Payer: Self-pay | Admitting: Surgery

## 2011-09-11 ENCOUNTER — Encounter: Payer: Medicare Other | Admitting: Physician Assistant

## 2011-09-11 ENCOUNTER — Ambulatory Visit (INDEPENDENT_AMBULATORY_CARE_PROVIDER_SITE_OTHER): Payer: Self-pay | Admitting: Surgery

## 2011-09-11 ENCOUNTER — Ambulatory Visit (HOSPITAL_COMMUNITY): Payer: Medicare Other | Admitting: Oncology

## 2011-09-11 ENCOUNTER — Other Ambulatory Visit: Payer: Self-pay | Admitting: *Deleted

## 2011-09-11 VITALS — BP 116/75 | HR 90 | Resp 16 | Ht 63.5 in | Wt 117.0 lb

## 2011-09-11 DIAGNOSIS — I359 Nonrheumatic aortic valve disorder, unspecified: Secondary | ICD-10-CM

## 2011-09-11 DIAGNOSIS — I2510 Atherosclerotic heart disease of native coronary artery without angina pectoris: Secondary | ICD-10-CM

## 2011-09-11 MED ORDER — POTASSIUM CHLORIDE CRYS ER 20 MEQ PO TBCR: 20.0000 meq | EXTENDED_RELEASE_TABLET | Freq: Every day | ORAL | Status: DC

## 2011-09-11 MED ORDER — FUROSEMIDE 40 MG PO TABS: 40.0000 mg | ORAL_TABLET | Freq: Every day | ORAL | Status: DC

## 2011-09-11 NOTE — Patient Instructions (Signed)
You may return to driving when you feel comfortable with that.  Do not lift anything heavier than 10 lbs for three months postoperatively. Return to see me if any problems develop with you incision; such as redness, swelling, or drainage.

## 2011-09-11 NOTE — Progress Notes (Signed)
HPI: Patient returns for routine postoperative follow-up having undergone coronary artery bypass graft surgery x1 and aortic valve replacement with a pericardial valve on 08/10/2011. The patient's early postoperative recovery while in the hospital was notable for postoperative atrial fibrillation. She converted to sinus rhythm on amiodarone. Since hospital discharge the patient was readmitted to the hospital briefly from 08/27/2011 to 08/29/2011 for shortness of breath with a large left pleural effusion. This was treated with ultrasound-guided thoracentesis. Since her most recent discharge she said she has been feeling much better. She has had no shortness of breath. She is walking without difficulty.   Current Outpatient Prescriptions  Medication Sig Dispense Refill  . acetaminophen (TYLENOL) 325 MG tablet Take 650 mg by mouth every 6 (six) hours as needed. For pain       . amiodarone (PACERONE) 400 MG tablet Take 400 mg by mouth 2 (two) times daily. Until 8/30 then 200 mg twice a day      . aspirin EC 325 MG tablet Take 325 mg by mouth daily.        . Calcium Carbonate (CALTRATE 600 PO) Take 1 tablet by mouth daily.        . carvedilol (COREG) 25 MG tablet Take 25 mg by mouth 2 (two) times daily with a meal.        . diphenhydrAMINE (BENADRYL) 25 MG tablet Take 25 mg by mouth every 6 (six) hours as needed. For sleep      . ferrous gluconate (FERGON) 324 MG tablet Take 324 mg by mouth 2 (two) times daily.        . furosemide (LASIX) 40 MG tablet Take 1 tablet (40 mg total) by mouth daily.  90 tablet  3  . omeprazole (PRILOSEC) 20 MG capsule Take 20 mg by mouth daily.        . potassium chloride SA (KLOR-CON M20) 20 MEQ tablet Take 1 tablet (20 mEq total) by mouth daily.  90 tablet  3  . aspirin EC 81 MG tablet Take 325 mg by mouth daily.       Marland Kitchen azithromycin (ZITHROMAX) 250 MG tablet Take 250-500 mg by mouth daily. Patient takes 500 mg on day 1 and 250 mg on days 2-5      . Cranberry 250 MG  CAPS Take 250 mg by mouth daily.        . hydrochlorothiazide 25 MG tablet Take 25 mg by mouth daily.        . metoprolol succinate (TOPROL-XL) 25 MG 24 hr tablet Take 25 mg by mouth 2 (two) times daily.        . metoprolol tartrate (LOPRESSOR) 25 MG tablet Take 25 mg by mouth 2 (two) times daily.        . predniSONE (DELTASONE) 5 MG tablet Take 5 mg by mouth daily. x6 days since 07/25/11       . ramipril (ALTACE) 10 MG tablet Take 10 mg by mouth daily.        . rosuvastatin (CRESTOR) 40 MG tablet Take 40 mg by mouth at bedtime.        . traMADol (ULTRAM) 50 MG tablet Take 50 mg by mouth every 4 (four) hours as needed. For pain        Physical Exam: BP 116/75  Pulse 90  Resp 16  Ht 5' 3.5" (1.613 m)  Wt 117 lb (53.071 kg)  BMI 20.40 kg/m2  SpO2 99%  She looks well Cardiac exam shows a regular rate  and rhythm with normal heart sounds. There is no murmur. Her lung exam reveals decreased breath sounds at the left base. The chest incision is healing well and sternum is stable. There is mild bilateral ankle edema.  Diagnostic Tests: Chest x-ray today shows a decrease in the left pleural effusion compared to her previous chest x-ray. There is mild left basilar atelectasis.  Impression: Overall she is doing well following her surgery, especially considering her age.  Plan: I encouraged her to continue walking as much as possible. She will continue to follow up with  Dr. [name] and will contact me if she develops any problems with her incisions.

## 2011-09-15 NOTE — H&P (Signed)
[name], [name]             ACCOUNT NO.:  [id number]  MEDICAL RECORD NO.:  [id number]  LOCATION:  [id number]                         FACILITY:  [facility]  PHYSICIAN:  [name], MD      DATE OF BIRTH:  Nov 30, 1922  DATE OF ADMISSION:  07/28/2011 DATE OF DISCHARGE:                             HISTORY & PHYSICAL   PRIMARY CARE PHYSICIAN:  Dr. Orson Ape in [place], [place].  PRIMARY CARDIOLOGIST:  None.  CHIEF COMPLAINT:  Shortness of breath and CHF.  HISTORY OF PRESENT ILLNESS:  [name] is an 77 year old female with no previous cardiac issues, except for heart murmur that she was told about a couple of years ago.  She became too hot on July 15, 2011.  She developed chest tightness, and since then has had chest tightness with exertion.  It reaches 5/10.  It would eventually go away and she is not sure of the duration of any episodes or how many episodes total there were.  She denies PND or orthopnea.  She has had chronic daytime edema, which is unchanged.  She did not have any associated symptoms, specifically with the chest pain and the chest pain did not radiate. She had no episodes of chest pain that started at rest.  On July 25, 2011, she went to an Urgent Care for her symptoms and was given antibiotics and steroids for possible sinus infection and bronchitis. Her chest pain worsened and she hurt all night last night.  Today, she was more short of breath and was short of breath at rest as well as significantly short of breath when she tried to do anything.  She went to the [facility] emergency room and was diagnosed with congestive heart failure.  She received Lasix 20 mg IV as well as IV nitroglycerin, aspirin 81 mg x4, and Lovenox 55 mg.  Her symptoms are much improved now and she is resting comfortably.  PAST MEDICAL HISTORY: 1. History of a systolic ejection murmur, diagnosed a couple of years     ago. 2. Hypertension. 3. Hyperlipidemia. 4. History of  anemia in the setting of erosive esophagitis. 5. Osteoporosis. 6. History of cleft palate as a child.  SURGICAL HISTORY:  She is status post cleft palate repair as well as bilateral partial mastectomy, right hernia repair x2, vaginal hysterectomy, EGD, and coloscopy.  ALLERGIES:  MORPHINE, SULFA, AND PENICILLIN.  CURRENT MEDICATIONS: 1. Aspirin 81 mg a day. 2. Tylenol p.r.n. 3. Benadryl p.r.n.. 4. Altace 10 mg a day. 5. Prilosec daily.  SOCIAL HISTORY:  She lives in [place] alone, but her daughter lives nearby.  She is retired, working for [employer].  She has no history of alcohol, tobacco, and drug abuse.  FAMILY HISTORY:  Her mother died at 86 with a stroke and her father died at 30 and had had heart disease for several years.  No siblings have cardiac issues.  REVIEW OF SYSTEMS:  She has chronic lower extremity edema, wear supporters for this daily.  This has not changed recently.  She has some vision and hearing loss.  She has a speech impediment because of the cleft palate.  She has had occasional reflux symptoms, but denies  melena.  She has occasional arthralgias and joint pains.  The chest tightness is described above.  She has had an occasional cough that is nonproductive.  She has not had any fevers or chills.  She has some dyspnea on exertion that is fairly recent and shortness of breath within the last 24 hours.  Full 14-point review of systems is otherwise negative except as stated in the HPI.  PHYSICAL EXAM:  VITAL SIGNS:  She is afebrile.  Blood pressure is 112/77, heart rate 120, respiratory rate 22, O2 saturation 94% on 2 liters. GENERAL:  She is a well-developed elderly white female, in no acute distress at rest, on O2. HEENT:  Normal for age. NECK:  There is no lymphadenopathy, thyromegaly, or bruits noted, but she has JVD at approximately 10 cm. CVA:  Her heart is regular in rate and rhythm with a 3/6 systolic ejection murmur that is very high  pitched "honking" type that is across her whole precordium.  Her S2 is obscured.  Distal pulses are intact in all four extremities. LUNGS:  She has crackles halfway up the bases. SKIN:  No rashes or lesions are noted. ABDOMEN:  Soft and nontender with active bowel sounds. EXTREMITIES:  There is no cyanosis, clubbing, or edema noted. MUSCULOSKELETAL:  There is no joint deformity or effusions and no spine or CVA tenderness. NEURO:  She is alert and oriented with cranial nerves II-XII grossly intact.  Chest x-ray, CHF.  EKG, sinus tachycardia with a right bundle-branch block.  LABORATORY VALUES:  Hemoglobin 11.8, hematocrit 35.9, WBC is 11.8, platelets 316, D-dimer 0.96.  Sodium 139, potassium 4.2, chloride 102, CO2 17, BUN 50, creatinine 1.6, glucose 231, GFR 31, troponin-I 0.54. BNP [id number].  IMPRESSION:  [name] was seen today by Dr. Aundra Dubin, the patient evaluated and the data reviewed.  She is an 77 year old female with a history of hypertension and a heart murmur, who presented to the ER at [facility] with approximately 14 days of dyspnea and chest tightness on and off.  The chest tightness was worse yesterday and last night, so she went to the ER.  In the ER, she was noted to be in pulmonary edema with a significantly elevated pro BNP. 1. Congestive heart failure:  This is acute.  She has been given one     dose of Lasix in the emergency room at 20 mg.  She has a loud     murmur on exam that is probably AS.  She is volume overload and     tachycardia.  She has a mild elevation in her troponin and this is     possibly secondary to MI with left ventricular systolic function,     but she has no Q-waves on EKG.  We suspect decreased cardiac output     with sinus tach.  A stat echocardiogram was ordered and shows an EF     of approximately 20% with global hypokinesis, but the hypokinesis     appears worse in the anterior wall and anteroseptum.  She has a     severely calcified  aortic valve that is not severe AS, by     gradient, but she has possible low - gradient severe AS.  It is     possible that she had a large anterior myocardial infarction     several days ago, but she has no Q-waves on EKG.  She could also     have severe AS that is low in gradient with  cardiomyopathy     secondary to longstanding aortic stenosis and decompensation.     Initially, she was on IV nitroglycerin, but we will discontinue     this and start nesiritide at 0.005 mcg/kg/minute.  She will be     diuresed with Lasix 40 mg IV q.8 h. 2. Coronary artery disease:  She possibly had acute coronary syndrome     versus demand ischemia from her acute congestive heart failure,     which could account for the mild increase in her troponin.  There     is concern for possible subacute myocardial infarction with     decreased left ventricular systolic function, but no Q-waves on her     EKG.  She will be continued on aspirin and we will add a heparin     drip as well as a statin.  We will cycle cardiac enzymes and she     will eventually need a cardiac catheterization.  We will continue     to follow her closely.     Rosaria Ferries, PA-C   ______________________________ [name], MD    RB/MEDQ  D:  07/28/2011  T:  07/28/2011  Job:  [id number]  Electronically Signed by Rosaria Ferries PA-C on 08/09/2011 02:41:48 PM Electronically Signed by [name] MD on 09/15/2011 10:55:15 PM

## 2011-09-15 NOTE — Cardiovascular Report (Signed)
[name], [name]             ACCOUNT NO.:  [id number]  MEDICAL RECORD NO.:  [id number]  LOCATION:  2918                         FACILITY:  [facility]  PHYSICIAN:  Roberta Champagne, MD      DATE OF BIRTH:  Sep 29, 1922  DATE OF PROCEDURE:  08/03/2011 DATE OF DISCHARGE:                           CARDIAC CATHETERIZATION   PROCEDURE:  Coronary angiography.  INDICATIONS:  The patient has severe aortic stenosis.  She had elevated cardiac enzymes in the setting of systolic congestive heart failure. She has a global hypokinesis with an EF of about 25%.  PROCEDURE NOTE:  After informed was obtained, the right groin was sterilely prepped and draped.  Lidocaine 1% was used to locally anesthetize the right groin area.  The right common femoral artery was entered using modified Seldinger technique and a 5-French arterial sheath was placed.  Of note, the femoral artery was significantly calcified.  The right coronary artery was engaged with the JR-4 catheter.  Left coronary artery was engaged with the JL-4 catheter.  We used about 30 mL of contrast of this study.  FINDINGS: 1. Hemodynamics aorta 111/74.  We did not cross the valve into the     left ventricle as the patient has definite severe aortic stenosis     by echocardiogram. 2. Left ventriculography.  Left ventriculography was not done as we     did not cross the valve and also the patient has chronic kidney     disease and we are trying to conserve contrast. 3. Right coronary artery.  The right coronary artery was a relatively     small vessel but was dominant.  There was about a 40% proximal RCA     stenosis, luminal irregularities throughout the rest of the RCA. 4. Left main.  The left main was short with luminal irregularities. 5. Circumflex of the left circumflex system.  There was 20% ostial     circumflex stenosis.  There was a small first obtuse marginal with     about 50% ostial stenosis.  There was a large branching of second     obtuse marginal with minimal disease.  There was a PLOM with     minimal disease. 6. LAD system.  The LAD system through its entirety was heavily     calcified.  There was a very small first diagonal with 99% ostial     stenosis.  The proximal LAD was heavily calcified.  There is about     40% stenosis.  There is a small second diagonal that had about 50%     ostial stenosis.  In the mid LAD, there was the takeoff of a rather     large third diagonal.  The third diagonal was easily the size of     the continuation of the LAD.  This large third diagonal had about     50% midvessel stenosis.  Just after the takeoff of the third     diagonal, there was a focal area of calcification in the mid LAD     with about 80% stenosis.  The remainder of the mid-to-distal LAD     had diffuse disease and was small caliber.  There  was another 50%     mid LAD stenosis and about 60% distal LAD stenosis.  IMPRESSION:  The left anterior descending is calcified and diffusely diseased.  There is a large third diagonal that is about as large as the left anterior descending continuation.  This vessel has about 50% midvessel stenosis just after its takeoff and is seen best in the AP cranial view.  There is a chunk of calcium in the mid LAD with a focal approximately 80% stenosis.  The distal LAD is diffusely diseased and small with serial 50 and 60% stenoses.  There is no flow limitation.  We need to discuss these findings with Dr. Cyndia Bent regarding the feasibility of grafting the LAD with a LIMA as it is a small diffusely diseased vessel.  I only used 30 mL of contrast for this study.  We will very gently hydrate her post- cath.     Roberta Champagne, MD     DM/MEDQ  D:  08/03/2011  T:  08/03/2011  Job:  [id number]  Electronically Signed by Roberta Champagne MD on 09/15/2011 11:03:52 PM

## 2011-09-15 NOTE — H&P (Signed)
[name], [name]             ACCOUNT NO.:  [id number]  MEDICAL RECORD NO.:  [id number]  LOCATION:  2003                         FACILITY:  [facility]  PHYSICIAN:  [name], MD      DATE OF BIRTH:  [birth year]  DATE OF ADMISSION:  08/27/2011 DATE OF DISCHARGE:                             HISTORY & PHYSICAL   PRIMARY CARE PHYSICIAN:  Leonides Grills, MD.  HISTORY OF PRESENT ILLNESS:  This is an 77 year old with a history of severe aortic stenosis status post aortic valve replacement on August 11, 2011, CAD status post LIMA on August 11, 2011, as well, and nonischemic cardiomyopathy suspected to be secondary to severe aortic stenosis, presents today for shortness of breath.  The patient returned home from bypass surgery and bioprosthetic aortic valve replacement on August 16, 2011.  She had 5 days of Lasix at discharge, then Lasix was stopped.  She since then reports steadily increasing exertional dyspnea over the weekend and shortness of breath was quite severe and the patient short of breath just walking around her around the room at home. She was orthopneic and having a sleep with a number of pillows.  P.o. Lasix was restarted yesterday with some relief, but oxygen saturation was in the upper 80s on room air when Home Health came, so she was sent to the [facility] Emergency Room.  In the ER, chest x-ray showed large left-sided pleural effusion and small right-sided pleural effusion.  The patient was transferred to [facility].  Of note, the patient denies chest pain.  ALLERGIES:  PENICILLIN.  MEDICATIONS: 1. Amiodarone 400 mg b.i.d. 2. Crestor 40 mg daily. 3. Lopressor 25 mg b.i.d. 4. Aspirin 325 mg daily. 5. Omeprazole 20 mg daily.  PAST MEDICAL HISTORY: 1. Hypertension. 2. Hyperlipidemia. 3. Erosive esophagitis. 4. Status post cleft palate repair. 5. Osteoporosis. 6. Bilateral partial mastectomies. 7. History of hysterectomy. 8. Chronic  kidney disease. 9. Coronary artery disease.  The patient had a left heart     catheterization in August 2012, with 80% mid-LAD calcified     stenosis.  There is diffuse moderate distal LAD disease.  The     patient had a LIMA to the LAD with her aortic valve replacement on     August 11, 2011. 10.Severe aortic stenosis status post aortic valve replacement with a     #23 Edwards pericardial bioprosthetic valve on August 11, 2011. 11.Postoperative atrial fibrillation in August 2012, after her     CABG/AVR.  She went home on amiodarone. 12.Cardiomyopathy.  This is likely secondary to severe aortic     stenosis.  She did not have enough coronary disease to explain her     severe dilated cardiomyopathy.  Echo in August 2012, showed mild     LVH, EF 20%, global hypokinesis, severe aortic stenosis, pulmonary     systolic pressure 55 mmHg.  SOCIAL HISTORY:  The patient lives in [place].  She had been living alone, however, now her daughter is staying with her.  She has 2 daughters and son.  She is a not a smoker, does not drink any alcohol.  FAMILY HISTORY:  Mother died at 39 of a stroke.  Father died at 13 of coronary artery disease.  REVIEW OF SYSTEMS:  All systems were reviewed and were negative except as noted in the history of present illness.  PHYSICAL EXAMINATION:  VITAL SIGNS:  Temperature 97.5, pulse is 60 and regular, blood pressure 154/75, oxygen saturation 99% on 2 liters nasal cannula.  Telemetry shows normal sinus rhythm. GENERAL:  This is an elderly female, in no apparent distress. HEENT:  Normal exam. ABDOMEN:  Soft, nontender.  No hepatosplenomegaly.  Normal bowel sounds. NECK:  There is no thyromegaly or thyroid nodule.  JVP is elevated at 8- 9 cm of water. CARDIOVASCULAR:  Heart regular, S1 and S2.  No S3, no S4.  There is a 2/6 early systolic ejection murmur at the right upper sternal border. There is 1+ ankle edema. LUNGS:  There is decreased breath sounds, 3/4  way up on the left and halfway up on the right. EXTREMITIES:  No clubbing or cyanosis. CARDIOVASCULAR:  Alert and oriented x3. SKIN:  No lesions.  RADIOLOGY:  Chest x-ray shows bilateral left greater than right pleural effusions.  LABS:  White count 10.2, hematocrit 29.4, platelets 366.  Potassium 4.0, creatinine 1.81, discharge creatinine is 1.66.  AST and ALT are normal. BNP is 40,554.  Initial set of cardiac markers is negative.  IMPRESSION:  This is an 77 year old with chronic kidney disease, coronary artery disease status post coronary artery bypass graft, aortic stenosis status post aortic valve replacement, and nonischemic cardiomyopathy, who presents with bilateral pleural effusions and shortness of breath after recent bypass surgery with aortic valve replacement.  1. Congestive heart failure.  I suspect the patient has acute on     chronic systolic congestive heart failure.  The patient has been     off Lasix since 5 days after discharge.  She is volume overloaded     on exam with increased pro-BNP and bilateral pleural effusions.     The patient has nonischemic cardiomyopathy, this is likely     secondary to aortic stenosis.  We will get an echocardiogram to     assess EF post aortic valve replacement.  I am going to gently     diurese with Lasix 40 mg IV b.i.d.  We will need to follow her     creatinine carefully.  I am going to change her Lopressor to Coreg     6.25 mg b.i.d. given decreased EF. 2. Pleural effusions.  The patient has left greater than right pleural     effusions.  I think that she is probably getting a left     thoracentesis.  I will contacts CVTS.  I will keep n.p.o. midnight.     I will get bilateral decubitus films to make sure the effusions     layer. 3. Coronary artery disease.  The patient is status bypass grafting     with left internal mammary artery graft to the left anterior     descending coronary artery.  She has had no chest pain.  I do  not     think that the current presentation is related to coronary artery     disease.  She will continue aspirin and Crestor. 4. Postoperative atrial fibrillation.  The patient is remains in     normal sinus rhythm.  We will continue her amiodarone.  LFTs are     normal.  We will get a TSH. 5. Chronic kidney disease.  We are going to follow her renal function  closely, diuresis.     [name], MD     DM/MEDQ  D:  08/27/2011  T:  08/28/2011  Job:  [id number]  Electronically Signed by [name] MD on 09/15/2011 11:05:10 PM

## 2011-09-18 ENCOUNTER — Ambulatory Visit (HOSPITAL_COMMUNITY): Payer: Medicare Other | Admitting: Oncology

## 2011-09-20 ENCOUNTER — Ambulatory Visit (INDEPENDENT_AMBULATORY_CARE_PROVIDER_SITE_OTHER): Payer: Medicare Other | Admitting: Cardiology

## 2011-09-20 ENCOUNTER — Encounter: Payer: Self-pay | Admitting: Cardiology

## 2011-09-20 VITALS — BP 122/80 | HR 74 | Ht 63.5 in | Wt 118.4 lb

## 2011-09-20 DIAGNOSIS — I4891 Unspecified atrial fibrillation: Secondary | ICD-10-CM

## 2011-09-20 DIAGNOSIS — I2510 Atherosclerotic heart disease of native coronary artery without angina pectoris: Secondary | ICD-10-CM

## 2011-09-20 DIAGNOSIS — I359 Nonrheumatic aortic valve disorder, unspecified: Secondary | ICD-10-CM

## 2011-09-20 DIAGNOSIS — I509 Heart failure, unspecified: Secondary | ICD-10-CM

## 2011-09-20 DIAGNOSIS — E7800 Pure hypercholesterolemia, unspecified: Secondary | ICD-10-CM

## 2011-09-20 DIAGNOSIS — E785 Hyperlipidemia, unspecified: Secondary | ICD-10-CM

## 2011-09-20 DIAGNOSIS — I350 Nonrheumatic aortic (valve) stenosis: Secondary | ICD-10-CM

## 2011-09-20 LAB — CBC
HCT: 35.4 — ABNORMAL LOW
Hemoglobin: 11.6 — ABNORMAL LOW
MCV: 87
RBC: 4.07
WBC: 5.2

## 2011-09-20 MED ORDER — ROSUVASTATIN CALCIUM 20 MG PO TABS: 20.0000 mg | ORAL_TABLET | Freq: Every day | ORAL | Status: DC

## 2011-09-20 NOTE — Patient Instructions (Addendum)
Start Crestor 20mg  daily.  Stop amiodarone(pacerone) in 1 week.  Your physician recommends that you have  lab work in: 3 weeks --BMP 424.1  428.32-you have the order. You can go to [place]  lab across the street from [facility] ED. Please fax the results to Dr [phone].  Your physician recommends that you schedule a follow-up appointment in: 6 weeks with Dr Aundra Dubin.  Your physician recommends that you return for a FASTING lipid profile/liver profile in 2 months---424.1  428.[place]

## 2011-09-23 DIAGNOSIS — E785 Hyperlipidemia, unspecified: Secondary | ICD-10-CM | POA: Insufficient documentation

## 2011-09-23 DIAGNOSIS — I4891 Unspecified atrial fibrillation: Secondary | ICD-10-CM | POA: Insufficient documentation

## 2011-09-23 NOTE — Assessment & Plan Note (Signed)
Dilated cardiomyopathy likely secondary to severe aortic stenosis.  EF improved to 45-50% from 20% after AVR.  Continue Coreg.  Have not started ACEI due to CKD.  She appears near-euvolemic, so continue Lasix at current dose for now.  Will get BMET in 3 wks before followup in 6 weeks.

## 2011-09-23 NOTE — Progress Notes (Signed)
PCP: Dr. Orson Ape  77 yo presents for followup after CABG-AVR.  Patient presented to [facility] with acute systolic CHF/pulmonary edema in 8/12.  Echo showed EF 20%, global hypokinesis.  There was severe aortic stenosis.  Left heart cath showed a calcified 80% mid LAD stenosis.  The cardiomyopathy was thought to be secondary to aortic stenosis.  Patient had CABG-AVR with LIMA-LAD and bioprosthetic AVR in 8/12.  She had post-operative atrial fibrillation that terminated with amiodarone and has not returned.  She was readmitted later in 8/12 with a large left pleural effusion and underwent thoracentesis.  Since that time, she has been doing quite well.  She is getting home PT now but will start cardiac rehab at [facility] next week.  She is walking with a walker.  She tires easily but denies exertional dyspnea.  No chest pain.  She got a rash after starting Lipitor which resolved after stopping it.    ECG: NSR, iRBBB, LAFB  Labs (9/12): K 4.3, creatinine 1.2  Allergies: Lipitor (rash)  PMH: 1. CKD 2. Cardiomyopathy due to aortic stenosis.  Echo (7/12) with EF 20%, global hypokinesis, severe aortic stenosis.  Repeat Echo in 8/12 after AVR showed EF 45-50%, normal bioprosthetic aortic valve function.   3. Severe aortic stenosis: Bioprosthetic AVR in 8/12 (Edwards pericardial Magna-Ease #23).   4. Atrial fibrillation: post-operative after AVR in 8/12, converted to NSR with amiodarone.  5. CAD: LHC (8/12) with calcified plaque in LAD up to 80% stenosis in mid LAD, 50-60% serial distal LAD stenosis. LIMA-LAD at time of AVR.  6. Hyperlipidemia 7. HTN 8. Erosive esophagitis 9. Breast cancer s/p bilateral partial mastectomies  SH: Lives in [place] alone, widowed, has 3 children (1 in [place]).  Nonsmoker, no ETOH.   FH: Mother with CVA at 74, father with MI at 38.   ROS: All systems reviewed and negative except as per HPI.   Current Outpatient Prescriptions  Medication Sig Dispense Refill  .  acetaminophen (TYLENOL) 325 MG tablet Take 650 mg by mouth every 6 (six) hours as needed. For pain       . aspirin EC 325 MG tablet Take 325 mg by mouth daily.        . Calcium Carbonate (CALTRATE 600 PO) Take 1 tablet by mouth daily.        . carvedilol (COREG) 25 MG tablet Take 25 mg by mouth 2 (two) times daily with a meal.        . diphenhydrAMINE (BENADRYL) 25 MG tablet Take 25 mg by mouth every 6 (six) hours as needed. For sleep      . ferrous gluconate (FERGON) 324 MG tablet Take 324 mg by mouth 2 (two) times daily.        . furosemide (LASIX) 40 MG tablet Take 1 tablet (40 mg total) by mouth daily.  90 tablet  3  . omeprazole (PRILOSEC) 20 MG capsule Take 20 mg by mouth daily.        . potassium chloride SA (KLOR-CON M20) 20 MEQ tablet Take 1 tablet (20 mEq total) by mouth daily.  90 tablet  3  . traMADol (ULTRAM) 50 MG tablet Take 50 mg by mouth every 4 (four) hours as needed. For pain      . rosuvastatin (CRESTOR) 20 MG tablet Take 1 tablet (20 mg total) by mouth at bedtime.  30 tablet  3    BP 122/80  Pulse 74  Ht 5' 3.5" (1.613 m)  Wt 118 lb  6.4 oz (53.706 kg)  BMI 20.64 kg/m2 General: NAD Neck: No JVD, no thyromegaly or thyroid nodule.  Lungs: Decreased breath sounds left base. CV: Nondisplaced PMI.  Heart regular S1/S2, no S3/S4, 2/6 early SEM.  No peripheral edema.  No carotid bruit.  Normal pedal pulses.  Abdomen: Soft, nontender, no hepatosplenomegaly, no distention.  Skin: Intact without lesions or rashes.  Neurologic: Alert and oriented x 3.  Psych: Normal affect. Extremities: No clubbing or cyanosis.  HEENT: Normal.

## 2011-09-23 NOTE — Assessment & Plan Note (Signed)
Post-operative atrial fibrillation with no recurrence.  Will discontinue amiodarone in 1 week.

## 2011-09-23 NOTE — Assessment & Plan Note (Signed)
Rash with Lipitor so changing to Crestor 20.  Lipids/LFTs in 2 months.

## 2011-09-23 NOTE — Assessment & Plan Note (Signed)
No chest pain.  Had LIMA-LAD with AVR in 8/12.  Continue ASA, Coreg.  Restart statin: will use Crestor 20 mg daily since she had a rash with Lipitor.

## 2011-09-23 NOTE — Assessment & Plan Note (Signed)
Well-seated bioprosthetic aortic valve on last echo.

## 2011-09-26 LAB — CBC
Hemoglobin: 11.7 — ABNORMAL LOW
MCHC: 34.9
MCV: 85.9
RBC: 3.91

## 2011-10-04 LAB — FERRITIN: Ferritin: 11 ng/mL (ref 10–291)

## 2011-10-04 LAB — CBC
HCT: 34 % — ABNORMAL LOW (ref 36.0–46.0)
MCV: 86.5 fL (ref 78.0–100.0)
Platelets: 284 10*3/uL (ref 150–400)
RDW: 13 % (ref 11.5–15.5)

## 2011-10-04 LAB — DIFFERENTIAL
Basophils Absolute: 0.1 10*3/uL (ref 0.0–0.1)
Eosinophils Absolute: 0.1 10*3/uL (ref 0.0–0.7)
Eosinophils Relative: 1 % (ref 0–5)
Lymphs Abs: 1.4 10*3/uL (ref 0.7–4.0)
Monocytes Absolute: 0.3 10*3/uL (ref 0.1–1.0)

## 2011-10-09 ENCOUNTER — Encounter (HOSPITAL_COMMUNITY)
Admission: RE | Admit: 2011-10-09 | Discharge: 2011-10-09 | Disposition: A | Discharge status: Home / Self Care | Payer: Medicare Other | Source: Ambulatory Visit | Attending: Cardiology | Admitting: Cardiology

## 2011-10-09 ENCOUNTER — Other Ambulatory Visit: Payer: Self-pay | Admitting: *Deleted

## 2011-10-09 ENCOUNTER — Encounter (HOSPITAL_COMMUNITY): Payer: Self-pay

## 2011-10-09 DIAGNOSIS — I509 Heart failure, unspecified: Secondary | ICD-10-CM | POA: Insufficient documentation

## 2011-10-09 DIAGNOSIS — I2510 Atherosclerotic heart disease of native coronary artery without angina pectoris: Secondary | ICD-10-CM | POA: Insufficient documentation

## 2011-10-09 DIAGNOSIS — Z951 Presence of aortocoronary bypass graft: Secondary | ICD-10-CM | POA: Insufficient documentation

## 2011-10-09 DIAGNOSIS — Z5189 Encounter for other specified aftercare: Secondary | ICD-10-CM | POA: Insufficient documentation

## 2011-10-09 DIAGNOSIS — Z954 Presence of other heart-valve replacement: Secondary | ICD-10-CM | POA: Insufficient documentation

## 2011-10-09 LAB — CBC
HCT: 33.5 — ABNORMAL LOW
Hemoglobin: 11.3 — ABNORMAL LOW
MCHC: 33.9
RBC: 3.86 — ABNORMAL LOW

## 2011-10-09 LAB — DIFFERENTIAL
Lymphocytes Relative: 21
Monocytes Absolute: 0.4
Monocytes Relative: 7
Neutro Abs: 3.8

## 2011-10-09 LAB — BASIC METABOLIC PANEL
Calcium: 9.4
Chloride: 100
GFR calc non Af Amer: 34 — ABNORMAL LOW
Glucose, Bld: 98

## 2011-10-09 NOTE — Patient Instructions (Signed)
During orientation advised patient on arrival and appointment times what to wear, what to do before, during and after exercise. Reviewed attendance and class policy. Talked about inclement weather and class consultation policy.

## 2011-10-09 NOTE — Progress Notes (Signed)
Orientation completed. Pt scheduled to start on 10/15/11 at 9:30. Pt is registered and eager to get started. Pt records have been retrieved.

## 2011-10-10 ENCOUNTER — Other Ambulatory Visit: Payer: Self-pay | Admitting: *Deleted

## 2011-10-11 ENCOUNTER — Encounter: Payer: Self-pay | Admitting: Cardiology

## 2011-10-11 ENCOUNTER — Other Ambulatory Visit: Payer: Self-pay | Admitting: Cardiology

## 2011-10-11 LAB — BASIC METABOLIC PANEL
BUN: 31 mg/dL — ABNORMAL HIGH (ref 6–23)
Chloride: 104 mEq/L (ref 96–112)
Glucose, Bld: 79 mg/dL (ref 70–99)
Potassium: 4.6 mEq/L (ref 3.5–5.3)

## 2011-10-15 ENCOUNTER — Encounter (HOSPITAL_COMMUNITY): Payer: Medicare Other

## 2011-10-17 ENCOUNTER — Encounter (HOSPITAL_COMMUNITY): Payer: Medicare Other

## 2011-10-19 ENCOUNTER — Encounter (HOSPITAL_COMMUNITY): Payer: Medicare Other

## 2011-10-22 ENCOUNTER — Encounter (HOSPITAL_COMMUNITY)
Admission: RE | Admit: 2011-10-22 | Discharge: 2011-10-22 | Disposition: A | Discharge status: Home / Self Care | Payer: Medicare Other | Source: Ambulatory Visit | Attending: Cardiology | Admitting: Cardiology

## 2011-10-24 ENCOUNTER — Encounter (HOSPITAL_COMMUNITY)
Admission: RE | Admit: 2011-10-24 | Discharge: 2011-10-24 | Disposition: A | Discharge status: Home / Self Care | Payer: Medicare Other | Source: Ambulatory Visit | Attending: Cardiology | Admitting: Cardiology

## 2011-10-26 ENCOUNTER — Encounter (HOSPITAL_COMMUNITY)
Admission: RE | Admit: 2011-10-26 | Discharge: 2011-10-26 | Disposition: A | Discharge status: Home / Self Care | Payer: Medicare Other | Source: Ambulatory Visit | Attending: Cardiology | Admitting: Cardiology

## 2011-10-29 ENCOUNTER — Encounter (HOSPITAL_COMMUNITY)
Admission: RE | Admit: 2011-10-29 | Discharge: 2011-10-29 | Disposition: A | Discharge status: Home / Self Care | Payer: Medicare Other | Source: Ambulatory Visit | Attending: Cardiology | Admitting: Cardiology

## 2011-10-31 ENCOUNTER — Encounter (HOSPITAL_COMMUNITY)
Admission: RE | Admit: 2011-10-31 | Discharge: 2011-10-31 | Disposition: A | Discharge status: Home / Self Care | Payer: Medicare Other | Source: Ambulatory Visit | Attending: Cardiology | Admitting: Cardiology

## 2011-11-01 ENCOUNTER — Ambulatory Visit (INDEPENDENT_AMBULATORY_CARE_PROVIDER_SITE_OTHER): Payer: Medicare Other | Admitting: Cardiology

## 2011-11-01 ENCOUNTER — Other Ambulatory Visit: Payer: Medicare Other | Admitting: *Deleted

## 2011-11-01 ENCOUNTER — Encounter: Payer: Self-pay | Admitting: Cardiology

## 2011-11-01 DIAGNOSIS — I25810 Atherosclerosis of coronary artery bypass graft(s) without angina pectoris: Secondary | ICD-10-CM

## 2011-11-01 DIAGNOSIS — I509 Heart failure, unspecified: Secondary | ICD-10-CM

## 2011-11-01 DIAGNOSIS — I359 Nonrheumatic aortic valve disorder, unspecified: Secondary | ICD-10-CM

## 2011-11-01 DIAGNOSIS — E785 Hyperlipidemia, unspecified: Secondary | ICD-10-CM

## 2011-11-01 DIAGNOSIS — I2510 Atherosclerotic heart disease of native coronary artery without angina pectoris: Secondary | ICD-10-CM

## 2011-11-01 DIAGNOSIS — I350 Nonrheumatic aortic (valve) stenosis: Secondary | ICD-10-CM

## 2011-11-01 DIAGNOSIS — I4891 Unspecified atrial fibrillation: Secondary | ICD-10-CM

## 2011-11-01 LAB — HEPATIC FUNCTION PANEL
Albumin: 3.6 g/dL (ref 3.5–5.2)
Alkaline Phosphatase: 60 U/L (ref 39–117)
Bilirubin, Direct: 0.1 mg/dL (ref 0.0–0.3)
Total Bilirubin: 0.7 mg/dL (ref 0.3–1.2)

## 2011-11-01 LAB — LIPID PANEL
LDL Cholesterol: 49 mg/dL (ref 0–99)
Total CHOL/HDL Ratio: 2

## 2011-11-01 MED ORDER — ROSUVASTATIN CALCIUM 20 MG PO TABS: 20.0000 mg | ORAL_TABLET | Freq: Every day | ORAL | Status: DC

## 2011-11-01 NOTE — Patient Instructions (Signed)
Your physician recommends that you have  a FASTING lipid profile / liver profile  Today---424.1  414.05   Your physician recommends that you schedule a follow-up appointment in: 3 months with Dr Aundra Dubin.

## 2011-11-02 ENCOUNTER — Encounter (HOSPITAL_COMMUNITY)
Admission: RE | Admit: 2011-11-02 | Discharge: 2011-11-02 | Disposition: A | Discharge status: Home / Self Care | Payer: Medicare Other | Source: Ambulatory Visit | Attending: Cardiology | Admitting: Cardiology

## 2011-11-02 DIAGNOSIS — I509 Heart failure, unspecified: Secondary | ICD-10-CM | POA: Insufficient documentation

## 2011-11-02 DIAGNOSIS — Z5189 Encounter for other specified aftercare: Secondary | ICD-10-CM | POA: Insufficient documentation

## 2011-11-02 DIAGNOSIS — Z954 Presence of other heart-valve replacement: Secondary | ICD-10-CM | POA: Insufficient documentation

## 2011-11-02 DIAGNOSIS — Z951 Presence of aortocoronary bypass graft: Secondary | ICD-10-CM | POA: Insufficient documentation

## 2011-11-02 DIAGNOSIS — I2510 Atherosclerotic heart disease of native coronary artery without angina pectoris: Secondary | ICD-10-CM | POA: Insufficient documentation

## 2011-11-03 NOTE — Assessment & Plan Note (Signed)
Well-seated bioprosthetic aortic valve on last echo.

## 2011-11-03 NOTE — Assessment & Plan Note (Signed)
Check lipids/LFTs, goal LDL < 70.

## 2011-11-03 NOTE — Assessment & Plan Note (Signed)
Dilated cardiomyopathy likely secondary to severe aortic stenosis.  EF improved to 45-50% from 20% after AVR.  Continue Coreg.  Have not started ACEI due to CKD.  She appears near-euvolemic, so continue Lasix at current dose for now.

## 2011-11-03 NOTE — Assessment & Plan Note (Signed)
No chest pain.  Had LIMA-LAD with AVR in 8/12.  Continue ASA, Coreg, Crestor.

## 2011-11-03 NOTE — Assessment & Plan Note (Addendum)
Post-operative atrial fibrillation with no recurrence.  Now off amiodarone.

## 2011-11-03 NOTE — Progress Notes (Signed)
PCP: Dr. Orson Ape  77 yo presents for followup after CABG-AVR.  Patient presented to [facility] with acute systolic CHF/pulmonary edema in 8/12.  Echo showed EF 20%, global hypokinesis.  There was severe aortic stenosis.  Left heart cath showed a calcified 80% mid LAD stenosis.  The cardiomyopathy was thought to be secondary to aortic stenosis.  Patient had CABG-AVR with LIMA-LAD and bioprosthetic AVR in 8/12.  She had post-operative atrial fibrillation that terminated with amiodarone and has not returned.  She was readmitted later in 8/12 with a large left pleural effusion and underwent thoracentesis.    Since that time, she has been doing quite well.  She is doing cardiac rehab at [facility].  She is using a cane now when she leaves home (no longer uses a walker).  No exertional dyspnea or chest pain.  She is off amiodarone.    Labs (9/12): K 4.3, creatinine 1.2 Labs (10/12): K 4.6, creatinine 1.82  Allergies: Lipitor (rash)  PMH: 1. CKD 2. Cardiomyopathy due to aortic stenosis.  Echo (7/12) with EF 20%, global hypokinesis, severe aortic stenosis.  Repeat Echo in 8/12 after AVR showed EF 45-50%, normal bioprosthetic aortic valve function.   3. Severe aortic stenosis: Bioprosthetic AVR in 8/12 (Edwards pericardial Magna-Ease #23).   4. Atrial fibrillation: post-operative after AVR in 8/12, converted to NSR with amiodarone.  5. CAD: LHC (8/12) with calcified plaque in LAD up to 80% stenosis in mid LAD, 50-60% serial distal LAD stenosis. LIMA-LAD at time of AVR.  6. Hyperlipidemia 7. HTN 8. Erosive esophagitis 9. Breast cancer s/p bilateral partial mastectomies  SH: Lives in [place] alone, widowed, has 3 children (1 in [place]).  Nonsmoker, no ETOH.   FH: Mother with CVA at 86, father with MI at 46.   ROS: All systems reviewed and negative except as per HPI.   Current Outpatient Prescriptions  Medication Sig Dispense Refill  . acetaminophen (TYLENOL) 325 MG tablet Take 650 mg by  mouth every 6 (six) hours as needed. For pain       . aspirin EC 325 MG tablet Take 325 mg by mouth daily.        . Calcium Carbonate (CALTRATE 600 PO) Take 1 tablet by mouth daily.        . carvedilol (COREG) 6.25 MG tablet Take 6.25 mg by mouth 2 (two) times daily with a meal.        . diphenhydrAMINE (BENADRYL) 25 MG tablet Take 25 mg by mouth every 6 (six) hours as needed. For sleep      . ferrous gluconate (FERGON) 324 MG tablet Take 324 mg by mouth 2 (two) times daily.        . furosemide (LASIX) 40 MG tablet Take 1 tablet (40 mg total) by mouth daily.  90 tablet  3  . omeprazole (PRILOSEC) 20 MG capsule Take 20 mg by mouth daily.        . potassium chloride SA (KLOR-CON M20) 20 MEQ tablet Take 1 tablet (20 mEq total) by mouth daily.  90 tablet  3  . rosuvastatin (CRESTOR) 20 MG tablet Take 1 tablet (20 mg total) by mouth at bedtime.  90 tablet  3  . traMADol (ULTRAM) 50 MG tablet Take 50 mg by mouth every 4 (four) hours as needed. For pain        BP 122/80  Pulse 75  Ht 5\' 3"  (1.6 m)  Wt 121 lb 6.4 oz (55.067 kg)  BMI 21.51 kg/m2 General:  NAD Neck: No JVD, no thyromegaly or thyroid nodule.  Lungs: Decreased breath sounds left base. CV: Nondisplaced PMI.  Heart regular S1/S2, no S3/S4, 2/6 early SEM.  No peripheral edema.  No carotid bruit.  Normal pedal pulses.  Abdomen: Soft, nontender, no hepatosplenomegaly, no distention.  Neurologic: Alert and oriented x 3.  Psych: Normal affect. Extremities: No clubbing or cyanosis.

## 2011-11-05 ENCOUNTER — Encounter (HOSPITAL_COMMUNITY)
Admission: RE | Admit: 2011-11-05 | Discharge: 2011-11-05 | Disposition: A | Discharge status: Home / Self Care | Payer: Medicare Other | Source: Ambulatory Visit | Attending: Cardiology | Admitting: Cardiology

## 2011-11-06 ENCOUNTER — Telehealth: Payer: Self-pay | Admitting: Cardiology

## 2011-11-06 NOTE — Telephone Encounter (Signed)
Pt notified of recent lab results

## 2011-11-06 NOTE — Telephone Encounter (Signed)
Pt returning call from our office. Please call back.

## 2011-11-07 ENCOUNTER — Encounter (HOSPITAL_COMMUNITY)
Admission: RE | Admit: 2011-11-07 | Discharge: 2011-11-07 | Disposition: A | Discharge status: Home / Self Care | Payer: Medicare Other | Source: Ambulatory Visit | Attending: Cardiology | Admitting: Cardiology

## 2011-11-09 ENCOUNTER — Encounter (HOSPITAL_COMMUNITY)
Admission: RE | Admit: 2011-11-09 | Discharge: 2011-11-09 | Disposition: A | Discharge status: Home / Self Care | Payer: Medicare Other | Source: Ambulatory Visit | Attending: Cardiology | Admitting: Cardiology

## 2011-11-12 ENCOUNTER — Encounter (HOSPITAL_COMMUNITY)
Admission: RE | Admit: 2011-11-12 | Discharge: 2011-11-12 | Disposition: A | Discharge status: Home / Self Care | Payer: Medicare Other | Source: Ambulatory Visit | Attending: Cardiology | Admitting: Cardiology

## 2011-11-14 ENCOUNTER — Encounter (HOSPITAL_COMMUNITY): Payer: Medicare Other

## 2011-11-16 ENCOUNTER — Encounter (HOSPITAL_COMMUNITY): Payer: Medicare Other

## 2011-11-19 ENCOUNTER — Encounter (HOSPITAL_COMMUNITY): Payer: Medicare Other

## 2011-11-21 ENCOUNTER — Encounter (HOSPITAL_COMMUNITY)
Admission: RE | Admit: 2011-11-21 | Discharge: 2011-11-21 | Disposition: A | Discharge status: Home / Self Care | Payer: Medicare Other | Source: Ambulatory Visit | Attending: Cardiology | Admitting: Cardiology

## 2011-11-23 ENCOUNTER — Encounter (HOSPITAL_COMMUNITY)
Admission: RE | Admit: 2011-11-23 | Discharge: 2011-11-23 | Disposition: A | Discharge status: Home / Self Care | Payer: Medicare Other | Source: Ambulatory Visit | Attending: Cardiology | Admitting: Cardiology

## 2011-11-26 ENCOUNTER — Encounter (HOSPITAL_COMMUNITY)
Admission: RE | Admit: 2011-11-26 | Discharge: 2011-11-26 | Disposition: A | Discharge status: Home / Self Care | Payer: Medicare Other | Source: Ambulatory Visit | Attending: Cardiology | Admitting: Cardiology

## 2011-11-28 ENCOUNTER — Encounter (HOSPITAL_COMMUNITY)
Admission: RE | Admit: 2011-11-28 | Discharge: 2011-11-28 | Disposition: A | Discharge status: Home / Self Care | Payer: Medicare Other | Source: Ambulatory Visit | Attending: Cardiology | Admitting: Cardiology

## 2011-11-30 ENCOUNTER — Encounter (HOSPITAL_COMMUNITY): Payer: Medicare Other

## 2011-12-03 ENCOUNTER — Encounter (HOSPITAL_COMMUNITY)
Admission: RE | Admit: 2011-12-03 | Discharge: 2011-12-03 | Disposition: A | Discharge status: Home / Self Care | Payer: Medicare Other | Source: Ambulatory Visit | Attending: Cardiology | Admitting: Cardiology

## 2011-12-03 DIAGNOSIS — Z954 Presence of other heart-valve replacement: Secondary | ICD-10-CM | POA: Insufficient documentation

## 2011-12-03 DIAGNOSIS — I2510 Atherosclerotic heart disease of native coronary artery without angina pectoris: Secondary | ICD-10-CM | POA: Insufficient documentation

## 2011-12-03 DIAGNOSIS — Z5189 Encounter for other specified aftercare: Secondary | ICD-10-CM | POA: Insufficient documentation

## 2011-12-03 DIAGNOSIS — I509 Heart failure, unspecified: Secondary | ICD-10-CM | POA: Insufficient documentation

## 2011-12-03 DIAGNOSIS — Z951 Presence of aortocoronary bypass graft: Secondary | ICD-10-CM | POA: Insufficient documentation

## 2011-12-05 ENCOUNTER — Encounter (HOSPITAL_COMMUNITY)
Admission: RE | Admit: 2011-12-05 | Discharge: 2011-12-05 | Disposition: A | Discharge status: Home / Self Care | Payer: Medicare Other | Source: Ambulatory Visit | Attending: Cardiology | Admitting: Cardiology

## 2011-12-07 ENCOUNTER — Encounter (HOSPITAL_COMMUNITY)
Admission: RE | Admit: 2011-12-07 | Discharge: 2011-12-07 | Disposition: A | Discharge status: Home / Self Care | Payer: Medicare Other | Source: Ambulatory Visit | Attending: Cardiology | Admitting: Cardiology

## 2011-12-10 ENCOUNTER — Encounter (HOSPITAL_COMMUNITY): Payer: Medicare Other

## 2011-12-12 ENCOUNTER — Encounter (HOSPITAL_COMMUNITY)
Admission: RE | Admit: 2011-12-12 | Discharge: 2011-12-12 | Disposition: A | Discharge status: Home / Self Care | Payer: Medicare Other | Source: Ambulatory Visit | Attending: Cardiology | Admitting: Cardiology

## 2011-12-12 NOTE — Progress Notes (Signed)
Cardiac Rehabilitation Program Progress Report   Orientation:  10/09/2011 Graduate Date:  tbd Discharge Date:  tbd # of sessions completed: 3  Cardiologist: McLean,Dalton Family MD:  McGough,William Class Time:  09:30  A.  Exercise Program:  Tolerates exercise @ 2.1 METS for 15 minutes  B.  Mental Health:  Good mental attitude  C.  Education/Instruction/Skills  Knows THR for exercise and Uses Perceived Exertion Scale and/or Dyspnea Scale  Uses Perceived Exertion Scale and/or Dyspnea Scale  D.  Nutrition/Weight Control/Body Composition:  Adherence to prescribed nutrition program: good   *This section completed by Derek Mound, Reed Pandy, RD, LDN, CDE  E.  Blood Lipids    Lab Results  Component Value Date   CHOL 126 11/01/2011     Lab Results  Component Value Date   TRIG 82.0 11/01/2011     Lab Results  Component Value Date   HDL 60.20 11/01/2011     Lab Results  Component Value Date   CHOLHDL 2 11/01/2011     No results found for this basename: LDLDIRECT      F.  Lifestyle Changes:  Making positive lifestyle changes  G.  Symptoms noted with exercise:  Asymptomatic  Report Completed By:  Norlene Duel   Comments:  This is patients 1st week report. She achieved a peak mets of 2.1. Her resting HR is 81 and her resting BP is 120/70. Her peak HR is 81 and her peak BP is 130/70. A halfway report will follow when she reaches her 18th visit.

## 2011-12-12 NOTE — Progress Notes (Signed)
Cardiac Rehabilitation Program Progress Report   Orientation:  10/09/2011 Graduate Date:  tbd Discharge Date:  tbd # of sessions completed: 18  Cardiologist: [name] Family MD:  Elsie Lincoln Class Time:  09:30  A.  Exercise Program:  Tolerates exercise @ 1.3 METS for 15 minutes  B.  Mental Health:  Good mental attitude  C.  Education/Instruction/Skills  Knows THR for exercise and Uses Perceived Exertion Scale and/or Dyspnea Scale  Uses Perceived Exertion Scale and/or Dyspnea Scale  D.  Nutrition/Weight Control/Body Composition:  Adherence to prescribed nutrition program: good   *This section completed by Derek [place]ound, Reed Pandy, RD, LDN, CDE  E.  Blood Lipids    Lab Results  Component Value Date   CHOL 126 11/01/2011     Lab Results  Component Value Date   TRIG 82.0 11/01/2011     Lab Results  Component Value Date   HDL 60.20 11/01/2011     Lab Results  Component Value Date   CHOLHDL 2 11/01/2011     No results found for this basename: LDLDIRECT      F.  Lifestyle Changes:  Making positive lifestyle changes  G.  Symptoms noted with exercise:  Asymptomatic  Report Completed By:  Norlene Duel   Comments:  This is patients Halfway report. She achieved a peak Mets of 1.3. Her resting HR is 78 and her resting BP is 100/60. Her peak HR is107 and her peak BP is 122/80. A graduation report will follow on her 36th session.

## 2011-12-14 ENCOUNTER — Encounter (HOSPITAL_COMMUNITY)
Admission: RE | Admit: 2011-12-14 | Discharge: 2011-12-14 | Disposition: A | Discharge status: Home / Self Care | Payer: Medicare Other | Source: Ambulatory Visit | Attending: Cardiology | Admitting: Cardiology

## 2011-12-17 ENCOUNTER — Encounter (HOSPITAL_COMMUNITY)
Admission: RE | Admit: 2011-12-17 | Discharge: 2011-12-17 | Disposition: A | Discharge status: Home / Self Care | Payer: Medicare Other | Source: Ambulatory Visit | Attending: Cardiology | Admitting: Cardiology

## 2011-12-19 ENCOUNTER — Encounter (HOSPITAL_COMMUNITY): Payer: Medicare Other

## 2011-12-21 ENCOUNTER — Encounter (HOSPITAL_COMMUNITY)
Admission: RE | Admit: 2011-12-21 | Discharge: 2011-12-21 | Disposition: A | Discharge status: Home / Self Care | Payer: Medicare Other | Source: Ambulatory Visit | Attending: Cardiology | Admitting: Cardiology

## 2011-12-24 ENCOUNTER — Encounter (HOSPITAL_COMMUNITY): Payer: Medicare Other

## 2011-12-26 ENCOUNTER — Encounter (HOSPITAL_COMMUNITY)
Admission: RE | Admit: 2011-12-26 | Discharge: 2011-12-26 | Disposition: A | Discharge status: Home / Self Care | Payer: Medicare Other | Source: Ambulatory Visit | Attending: Cardiology | Admitting: Cardiology

## 2011-12-28 ENCOUNTER — Encounter (HOSPITAL_COMMUNITY)
Admission: RE | Admit: 2011-12-28 | Discharge: 2011-12-28 | Disposition: A | Discharge status: Home / Self Care | Payer: Medicare Other | Source: Ambulatory Visit | Attending: Cardiology | Admitting: Cardiology

## 2011-12-31 ENCOUNTER — Encounter (HOSPITAL_COMMUNITY): Payer: Medicare Other

## 2012-01-02 ENCOUNTER — Encounter (HOSPITAL_COMMUNITY): Admission: RE | Admit: 2012-01-02 | Payer: Medicare Other | Source: Ambulatory Visit

## 2012-01-04 ENCOUNTER — Encounter (HOSPITAL_COMMUNITY): Payer: Medicare Other

## 2012-01-07 ENCOUNTER — Encounter (HOSPITAL_COMMUNITY): Payer: Medicare Other

## 2012-01-07 DIAGNOSIS — IMO0002 Reserved for concepts with insufficient information to code with codable children: Secondary | ICD-10-CM | POA: Diagnosis not present

## 2012-01-07 DIAGNOSIS — J069 Acute upper respiratory infection, unspecified: Secondary | ICD-10-CM | POA: Diagnosis not present

## 2012-01-07 DIAGNOSIS — J343 Hypertrophy of nasal turbinates: Secondary | ICD-10-CM | POA: Diagnosis not present

## 2012-01-09 ENCOUNTER — Encounter (HOSPITAL_COMMUNITY): Payer: Medicare Other

## 2012-01-09 DIAGNOSIS — Z954 Presence of other heart-valve replacement: Secondary | ICD-10-CM | POA: Insufficient documentation

## 2012-01-09 DIAGNOSIS — Z5189 Encounter for other specified aftercare: Secondary | ICD-10-CM | POA: Insufficient documentation

## 2012-01-09 DIAGNOSIS — Z951 Presence of aortocoronary bypass graft: Secondary | ICD-10-CM | POA: Insufficient documentation

## 2012-01-09 DIAGNOSIS — I2510 Atherosclerotic heart disease of native coronary artery without angina pectoris: Secondary | ICD-10-CM | POA: Insufficient documentation

## 2012-01-09 DIAGNOSIS — I509 Heart failure, unspecified: Secondary | ICD-10-CM | POA: Insufficient documentation

## 2012-01-11 ENCOUNTER — Encounter (HOSPITAL_COMMUNITY): Payer: Medicare Other

## 2012-01-14 ENCOUNTER — Encounter (HOSPITAL_COMMUNITY): Payer: Medicare Other

## 2012-01-16 ENCOUNTER — Encounter (HOSPITAL_COMMUNITY): Payer: Medicare Other

## 2012-01-18 ENCOUNTER — Encounter (HOSPITAL_COMMUNITY): Payer: Medicare Other

## 2012-01-21 ENCOUNTER — Encounter (HOSPITAL_COMMUNITY)
Admission: RE | Admit: 2012-01-21 | Discharge: 2012-01-21 | Disposition: A | Discharge status: Home / Self Care | Payer: Medicare Other | Source: Ambulatory Visit | Attending: Cardiology | Admitting: Cardiology

## 2012-01-21 DIAGNOSIS — I2510 Atherosclerotic heart disease of native coronary artery without angina pectoris: Secondary | ICD-10-CM | POA: Diagnosis not present

## 2012-01-21 DIAGNOSIS — Z5189 Encounter for other specified aftercare: Secondary | ICD-10-CM | POA: Diagnosis not present

## 2012-01-21 DIAGNOSIS — I509 Heart failure, unspecified: Secondary | ICD-10-CM | POA: Diagnosis not present

## 2012-01-21 DIAGNOSIS — Z951 Presence of aortocoronary bypass graft: Secondary | ICD-10-CM | POA: Diagnosis not present

## 2012-01-21 DIAGNOSIS — Z954 Presence of other heart-valve replacement: Secondary | ICD-10-CM | POA: Diagnosis not present

## 2012-01-23 ENCOUNTER — Encounter (HOSPITAL_COMMUNITY)
Admission: RE | Admit: 2012-01-23 | Discharge: 2012-01-23 | Disposition: A | Discharge status: Home / Self Care | Payer: Medicare Other | Source: Ambulatory Visit | Attending: Cardiology | Admitting: Cardiology

## 2012-01-25 ENCOUNTER — Encounter (HOSPITAL_COMMUNITY)
Admission: RE | Admit: 2012-01-25 | Discharge: 2012-01-25 | Disposition: A | Discharge status: Home / Self Care | Payer: Medicare Other | Source: Ambulatory Visit | Attending: Cardiology | Admitting: Cardiology

## 2012-01-28 ENCOUNTER — Encounter (HOSPITAL_COMMUNITY): Payer: Medicare Other

## 2012-01-28 DIAGNOSIS — IMO0002 Reserved for concepts with insufficient information to code with codable children: Secondary | ICD-10-CM | POA: Diagnosis not present

## 2012-01-28 DIAGNOSIS — I10 Essential (primary) hypertension: Secondary | ICD-10-CM | POA: Diagnosis not present

## 2012-01-28 DIAGNOSIS — J309 Allergic rhinitis, unspecified: Secondary | ICD-10-CM | POA: Diagnosis not present

## 2012-01-29 ENCOUNTER — Ambulatory Visit (INDEPENDENT_AMBULATORY_CARE_PROVIDER_SITE_OTHER): Payer: Medicare Other | Admitting: Cardiology

## 2012-01-29 ENCOUNTER — Encounter: Payer: Self-pay | Admitting: Cardiology

## 2012-01-29 VITALS — BP 124/64 | HR 80 | Ht 63.5 in | Wt 122.0 lb

## 2012-01-29 DIAGNOSIS — I2510 Atherosclerotic heart disease of native coronary artery without angina pectoris: Secondary | ICD-10-CM

## 2012-01-29 DIAGNOSIS — I359 Nonrheumatic aortic valve disorder, unspecified: Secondary | ICD-10-CM

## 2012-01-29 DIAGNOSIS — R0602 Shortness of breath: Secondary | ICD-10-CM | POA: Diagnosis not present

## 2012-01-29 DIAGNOSIS — I509 Heart failure, unspecified: Secondary | ICD-10-CM

## 2012-01-29 DIAGNOSIS — E785 Hyperlipidemia, unspecified: Secondary | ICD-10-CM

## 2012-01-29 DIAGNOSIS — I4891 Unspecified atrial fibrillation: Secondary | ICD-10-CM

## 2012-01-29 DIAGNOSIS — I350 Nonrheumatic aortic (valve) stenosis: Secondary | ICD-10-CM

## 2012-01-29 MED ORDER — CARVEDILOL 6.25 MG PO TABS: ORAL_TABLET | ORAL | Status: DC

## 2012-01-29 NOTE — Patient Instructions (Signed)
Increase coreg(carvedilol) to 9.375mg  twice a day. This will be one and one-half 6.25mg  tablets twice a day.  Your physician recommends that you have lab work today--BMET/BNP  Your physician recommends that you schedule a follow-up appointment in: 4 months with Dr Aundra Dubin.

## 2012-01-30 ENCOUNTER — Encounter (HOSPITAL_COMMUNITY)
Admission: RE | Admit: 2012-01-30 | Discharge: 2012-01-30 | Disposition: A | Discharge status: Home / Self Care | Payer: Medicare Other | Source: Ambulatory Visit

## 2012-01-30 ENCOUNTER — Encounter (HOSPITAL_COMMUNITY): Payer: Medicare Other

## 2012-01-30 LAB — BASIC METABOLIC PANEL
BUN: 39 mg/dL — ABNORMAL HIGH (ref 6–23)
CO2: 30 mEq/L (ref 19–32)
Chloride: 105 mEq/L (ref 96–112)
Creatinine, Ser: 1.6 mg/dL — ABNORMAL HIGH (ref 0.4–1.2)

## 2012-01-31 NOTE — Assessment & Plan Note (Signed)
Initial dilated cardiomyopathy found in 7/12 likely secondary to severe aortic stenosis.  EF improved to 45-50% from 20% after AVR.  Increase Coreg to 9.375 mg bid.  Have not started ACEI due to CKD.  She appears near-euvolemic, so continue Lasix at current dose for now.  Will get BMET/BNP today.

## 2012-01-31 NOTE — Assessment & Plan Note (Signed)
Lipids at goal (< 70) in 11/12.

## 2012-01-31 NOTE — Assessment & Plan Note (Addendum)
Well-seated bioprosthetic aortic valve on last echo in 8/12.  Needs antibiotics with dental work.

## 2012-01-31 NOTE — Assessment & Plan Note (Signed)
No chest pain.  Had LIMA-LAD with AVR in 8/12.  Continue ASA, Coreg, Crestor.

## 2012-01-31 NOTE — Assessment & Plan Note (Signed)
Post-operative atrial fibrillation with no recurrence.  Now off amiodarone.

## 2012-01-31 NOTE — Progress Notes (Signed)
PCP: Dr. Orson Ape  78 yo presents for followup after CABG-AVR.  Patient presented to [facility] with acute systolic CHF/pulmonary edema in 7/12.  Echo showed EF 20%, global hypokinesis.  There was severe aortic stenosis.  Left heart cath showed a calcified 80% mid LAD stenosis.  The cardiomyopathy was thought to be secondary to aortic stenosis.  Patient had CABG-AVR with LIMA-LAD and bioprosthetic AVR in 8/12.  She had post-operative atrial fibrillation that terminated with amiodarone and has not returned.  She was readmitted later in 8/12 with a large left pleural effusion and underwent thoracentesis.    Since that time, she has been doing quite well.  She is doing cardiac rehab at [facility].  She is using a cane now when she leaves home.  No exertional dyspnea or chest pain.  Repeat echo after surgery showed EF 45-50%.    Labs (9/12): K 4.3, creatinine 1.2 Labs (10/12): K 4.6, creatinine 1.82 Labs (11/12): LDL 49, HDL 60  Allergies: Lipitor (rash)  PMH: 1. CKD 2. Cardiomyopathy due to aortic stenosis.  Echo (7/12) with EF 20%, global hypokinesis, severe aortic stenosis.  Repeat Echo in 8/12 after AVR showed EF 45-50%, normal bioprosthetic aortic valve function.   3. Severe aortic stenosis: Bioprosthetic AVR in 8/12 (Edwards pericardial Magna-Ease #23).   4. Atrial fibrillation: post-operative after AVR in 8/12, converted to NSR with amiodarone.  5. CAD: LHC (8/12) with calcified plaque in LAD up to 80% stenosis in mid LAD, 50-60% serial distal LAD stenosis. LIMA-LAD at time of AVR.  6. Hyperlipidemia 7. HTN 8. Erosive esophagitis 9. Breast cancer s/p bilateral partial mastectomies  SH: Lives in [place] alone, widowed, has 3 children (1 in [place]).  Nonsmoker, no ETOH.   FH: Mother with CVA at 58, father with MI at 18.   ROS: All systems reviewed and negative except as per HPI.    Current Outpatient Prescriptions  Medication Sig Dispense Refill  . acetaminophen (TYLENOL) 325  MG tablet Take 650 mg by mouth every 6 (six) hours as needed. For pain       . aspirin EC 325 MG tablet Take 325 mg by mouth daily.        . Calcium Carbonate (CALTRATE 600 PO) Take 1 tablet by mouth daily.        . diphenhydrAMINE (BENADRYL) 25 MG tablet Take 25 mg by mouth every 6 (six) hours as needed. For sleep      . ferrous gluconate (FERGON) 324 MG tablet Take 324 mg by mouth daily with breakfast.       . furosemide (LASIX) 40 MG tablet Take 1 tablet (40 mg total) by mouth daily.  90 tablet  3  . omeprazole (PRILOSEC) 20 MG capsule Take 20 mg by mouth daily.        . potassium chloride SA (KLOR-CON M20) 20 MEQ tablet Take 1 tablet (20 mEq total) by mouth daily.  90 tablet  3  . rosuvastatin (CRESTOR) 20 MG tablet Take 1 tablet (20 mg total) by mouth at bedtime.  90 tablet  3  . carvedilol (COREG) 6.25 MG tablet Take 1 and 1/2 tablets twice a day  90 tablet  6  . traMADol (ULTRAM) 50 MG tablet Take 50 mg by mouth every 4 (four) hours as needed. For pain        BP 124/64  Pulse 80  Ht 5' 3.5" (1.613 m)  Wt 55.339 kg (122 lb)  BMI 21.27 kg/m2 General: NAD Neck: No JVD,  no thyromegaly or thyroid nodule.  Lungs: Decreased breath sounds left base. CV: Nondisplaced PMI.  Heart regular S1/S2, no S3/S4, 2/6 early SEM.  No peripheral edema.  No carotid bruit.  Normal pedal pulses.  Abdomen: Soft, nontender, no hepatosplenomegaly, no distention.  Neurologic: Alert and oriented x 3.  Psych: Normal affect. Extremities: No clubbing or cyanosis.

## 2012-02-01 ENCOUNTER — Encounter (HOSPITAL_COMMUNITY)
Admission: RE | Admit: 2012-02-01 | Discharge: 2012-02-01 | Disposition: A | Discharge status: Home / Self Care | Payer: Medicare Other | Source: Ambulatory Visit | Attending: Cardiology | Admitting: Cardiology

## 2012-02-01 ENCOUNTER — Encounter (HOSPITAL_COMMUNITY): Payer: Medicare Other

## 2012-02-01 DIAGNOSIS — I509 Heart failure, unspecified: Secondary | ICD-10-CM | POA: Diagnosis not present

## 2012-02-01 DIAGNOSIS — Z951 Presence of aortocoronary bypass graft: Secondary | ICD-10-CM | POA: Diagnosis not present

## 2012-02-01 DIAGNOSIS — I2510 Atherosclerotic heart disease of native coronary artery without angina pectoris: Secondary | ICD-10-CM | POA: Insufficient documentation

## 2012-02-01 DIAGNOSIS — Z5189 Encounter for other specified aftercare: Secondary | ICD-10-CM | POA: Insufficient documentation

## 2012-02-01 DIAGNOSIS — Z954 Presence of other heart-valve replacement: Secondary | ICD-10-CM | POA: Diagnosis not present

## 2012-02-04 ENCOUNTER — Encounter (HOSPITAL_COMMUNITY): Payer: Medicare Other

## 2012-02-04 ENCOUNTER — Encounter (HOSPITAL_COMMUNITY)
Admission: RE | Admit: 2012-02-04 | Discharge: 2012-02-04 | Disposition: A | Discharge status: Home / Self Care | Payer: Medicare Other | Source: Ambulatory Visit | Attending: Cardiology | Admitting: Cardiology

## 2012-02-06 ENCOUNTER — Encounter (HOSPITAL_COMMUNITY)
Admission: RE | Admit: 2012-02-06 | Discharge: 2012-02-06 | Disposition: A | Discharge status: Home / Self Care | Payer: Medicare Other | Source: Ambulatory Visit | Attending: Cardiology | Admitting: Cardiology

## 2012-02-06 ENCOUNTER — Encounter (HOSPITAL_COMMUNITY): Payer: Medicare Other

## 2012-02-08 ENCOUNTER — Encounter (HOSPITAL_COMMUNITY): Payer: Medicare Other

## 2012-02-08 ENCOUNTER — Encounter (HOSPITAL_COMMUNITY)
Admission: RE | Admit: 2012-02-08 | Discharge: 2012-02-08 | Disposition: A | Discharge status: Home / Self Care | Payer: Medicare Other | Source: Ambulatory Visit | Attending: Cardiology | Admitting: Cardiology

## 2012-02-11 ENCOUNTER — Encounter (HOSPITAL_COMMUNITY): Payer: Medicare Other

## 2012-02-13 ENCOUNTER — Encounter (HOSPITAL_COMMUNITY): Payer: Medicare Other

## 2012-02-15 ENCOUNTER — Encounter (HOSPITAL_COMMUNITY): Admission: RE | Admit: 2012-02-15 | Payer: Medicare Other | Source: Ambulatory Visit

## 2012-02-18 ENCOUNTER — Encounter (HOSPITAL_COMMUNITY)
Admission: RE | Admit: 2012-02-18 | Discharge: 2012-02-18 | Disposition: A | Discharge status: Home / Self Care | Payer: Medicare Other | Source: Ambulatory Visit | Attending: Cardiology | Admitting: Cardiology

## 2012-02-20 ENCOUNTER — Encounter (HOSPITAL_COMMUNITY)
Admission: RE | Admit: 2012-02-20 | Discharge: 2012-02-20 | Disposition: A | Discharge status: Home / Self Care | Payer: Medicare Other | Source: Ambulatory Visit | Attending: Cardiology | Admitting: Cardiology

## 2012-02-22 ENCOUNTER — Encounter (HOSPITAL_COMMUNITY): Payer: Medicare Other

## 2012-02-25 ENCOUNTER — Encounter (HOSPITAL_COMMUNITY)
Admission: RE | Admit: 2012-02-25 | Discharge: 2012-02-25 | Disposition: A | Discharge status: Home / Self Care | Payer: Medicare Other | Source: Ambulatory Visit | Attending: Cardiology | Admitting: Cardiology

## 2012-02-27 ENCOUNTER — Encounter (HOSPITAL_COMMUNITY)
Admission: RE | Admit: 2012-02-27 | Discharge: 2012-02-27 | Disposition: A | Discharge status: Home / Self Care | Payer: Medicare Other | Source: Ambulatory Visit | Attending: Cardiology | Admitting: Cardiology

## 2012-02-29 ENCOUNTER — Encounter (HOSPITAL_COMMUNITY)
Admission: RE | Admit: 2012-02-29 | Discharge: 2012-02-29 | Disposition: A | Discharge status: Home / Self Care | Payer: Medicare Other | Source: Ambulatory Visit | Attending: Cardiology | Admitting: Cardiology

## 2012-02-29 DIAGNOSIS — I2510 Atherosclerotic heart disease of native coronary artery without angina pectoris: Secondary | ICD-10-CM | POA: Insufficient documentation

## 2012-02-29 DIAGNOSIS — I509 Heart failure, unspecified: Secondary | ICD-10-CM | POA: Insufficient documentation

## 2012-02-29 DIAGNOSIS — Z951 Presence of aortocoronary bypass graft: Secondary | ICD-10-CM | POA: Diagnosis not present

## 2012-02-29 DIAGNOSIS — Z5189 Encounter for other specified aftercare: Secondary | ICD-10-CM | POA: Insufficient documentation

## 2012-02-29 DIAGNOSIS — Z954 Presence of other heart-valve replacement: Secondary | ICD-10-CM | POA: Insufficient documentation

## 2012-03-03 ENCOUNTER — Telehealth: Payer: Self-pay | Admitting: Cardiology

## 2012-03-03 NOTE — Telephone Encounter (Signed)
Nurse [facility] [phone]   Please return call to cardiac rehab nurse, concerning patient cardiac rehab maintenance docs.  She notes the information has been faxed twice.  She can be reached at [phone] until 3:30pm today.

## 2012-03-03 NOTE — Telephone Encounter (Signed)
Talked with Silva Bandy at [place]. She has the fax with the order for maintenance program at Card[facility] Rehab.

## 2012-03-06 ENCOUNTER — Encounter: Payer: Self-pay | Admitting: Cardiology

## 2012-03-06 DIAGNOSIS — H903 Sensorineural hearing loss, bilateral: Secondary | ICD-10-CM | POA: Diagnosis not present

## 2012-03-07 ENCOUNTER — Telehealth: Payer: Self-pay | Admitting: Cardiology

## 2012-03-07 NOTE — Telephone Encounter (Signed)
I spoke with Izora Gala at Cardiac [facility]hab and she has received the rehab request Horton Chin RN

## 2012-03-07 NOTE — Progress Notes (Signed)
Cardiac Rehabilitation Program Progress Report   Orientation:  10/09/2011 Graduate Date:  02/29/2012 Discharge Date:  02/29/2012 # of sessions completed: 36  Cardiologist: Arville Care MD:  Noreene Larsson Time:  09:30  A.  Exercise Program:  Tolerates exercise @ 2.3 METS for 15 minutes and Discharged to home exercise program.  Anticipated compliance:  excellent  B.  Mental Health:  Good mental attitude  C.  Education/Instruction/Skills  Knows THR for exercise, Uses Perceived Exertion Scale and/or Dyspnea Scale and Attended all education classes  Uses Perceived Exertion Scale and/or Dyspnea Scale  D.  Nutrition/Weight Control/Body Composition:  Adherence to prescribed nutrition program: good   *This section completed by Derek Mound, Reed Pandy, RD, LDN, CDE  E.  Blood Lipids    Lab Results  Component Value Date   CHOL 126 11/01/2011     Lab Results  Component Value Date   TRIG 82.0 11/01/2011     Lab Results  Component Value Date   HDL 60.20 11/01/2011     Lab Results  Component Value Date   CHOLHDL 2 11/01/2011     No results found for this basename: LDLDIRECT      F.  Lifestyle Changes:  Making positive lifestyle changes  G.  Symptoms noted with exercise:  Asymptomatic  Report Completed By:  Norlene Duel   Comments:  Mrs Helinski has progressed nicely to 30 minutes of aerobic exercise@Max  MET level of 2.3 and 10 minutes of strength and flexibility exercises.All patients vitals are WNL. Patient has met with ditecian. DC instructions have been reviewed in detail and Verbalized understanding. Patient plans to return here for the maintenance program Staff will do f/u at 1 month, 6 months, and 1 year. Patients had no complaints of abnormal S/S.

## 2012-03-07 NOTE — Telephone Encounter (Signed)
New problem:  Per Izora Gala calling from [facility] , Patient would like to start cardiac rehab on 3/11.  Fax request has been sent x 2.

## 2012-03-10 ENCOUNTER — Encounter (HOSPITAL_COMMUNITY)
Admission: RE | Admit: 2012-03-10 | Discharge: 2012-03-10 | Disposition: A | Discharge status: Home / Self Care | Payer: Medicare Other | Source: Ambulatory Visit | Attending: Cardiology | Admitting: Cardiology

## 2012-03-12 ENCOUNTER — Encounter (HOSPITAL_COMMUNITY)
Admission: RE | Admit: 2012-03-12 | Discharge: 2012-03-12 | Disposition: A | Discharge status: Home / Self Care | Payer: Medicare Other | Source: Ambulatory Visit | Attending: Cardiology | Admitting: Cardiology

## 2012-03-14 ENCOUNTER — Encounter (HOSPITAL_COMMUNITY)
Admission: RE | Admit: 2012-03-14 | Discharge: 2012-03-14 | Disposition: A | Discharge status: Home / Self Care | Payer: Medicare Other | Source: Ambulatory Visit | Attending: Cardiology | Admitting: Cardiology

## 2012-03-17 ENCOUNTER — Encounter (HOSPITAL_COMMUNITY)
Admission: RE | Admit: 2012-03-17 | Discharge: 2012-03-17 | Disposition: A | Discharge status: Home / Self Care | Payer: Medicare Other | Source: Ambulatory Visit | Attending: Cardiology | Admitting: Cardiology

## 2012-03-19 ENCOUNTER — Encounter (HOSPITAL_COMMUNITY)
Admission: RE | Admit: 2012-03-19 | Discharge: 2012-03-19 | Disposition: A | Discharge status: Home / Self Care | Payer: Medicare Other | Source: Ambulatory Visit | Attending: Cardiology | Admitting: Cardiology

## 2012-03-21 ENCOUNTER — Encounter (HOSPITAL_COMMUNITY)
Admission: RE | Admit: 2012-03-21 | Discharge: 2012-03-21 | Disposition: A | Discharge status: Home / Self Care | Payer: Medicare Other | Source: Ambulatory Visit | Attending: Cardiology | Admitting: Cardiology

## 2012-03-24 ENCOUNTER — Encounter (HOSPITAL_COMMUNITY): Payer: Medicare Other

## 2012-03-26 ENCOUNTER — Encounter (HOSPITAL_COMMUNITY): Payer: Medicare Other

## 2012-03-28 ENCOUNTER — Encounter (HOSPITAL_COMMUNITY)
Admission: RE | Admit: 2012-03-28 | Discharge: 2012-03-28 | Disposition: A | Discharge status: Home / Self Care | Payer: Medicare Other | Source: Ambulatory Visit | Attending: Cardiology | Admitting: Cardiology

## 2012-03-31 ENCOUNTER — Encounter (HOSPITAL_COMMUNITY)
Admission: RE | Admit: 2012-03-31 | Discharge: 2012-03-31 | Disposition: A | Discharge status: Home / Self Care | Payer: Medicare Other | Source: Ambulatory Visit | Attending: Cardiology | Admitting: Cardiology

## 2012-03-31 DIAGNOSIS — I509 Heart failure, unspecified: Secondary | ICD-10-CM | POA: Insufficient documentation

## 2012-03-31 DIAGNOSIS — Z951 Presence of aortocoronary bypass graft: Secondary | ICD-10-CM | POA: Insufficient documentation

## 2012-03-31 DIAGNOSIS — Z5189 Encounter for other specified aftercare: Secondary | ICD-10-CM | POA: Insufficient documentation

## 2012-03-31 DIAGNOSIS — Z954 Presence of other heart-valve replacement: Secondary | ICD-10-CM | POA: Insufficient documentation

## 2012-03-31 DIAGNOSIS — I2510 Atherosclerotic heart disease of native coronary artery without angina pectoris: Secondary | ICD-10-CM | POA: Insufficient documentation

## 2012-04-02 ENCOUNTER — Encounter (HOSPITAL_COMMUNITY)
Admission: RE | Admit: 2012-04-02 | Discharge: 2012-04-02 | Disposition: A | Discharge status: Home / Self Care | Payer: Medicare Other | Source: Ambulatory Visit | Attending: Cardiology | Admitting: Cardiology

## 2012-04-04 ENCOUNTER — Encounter (HOSPITAL_COMMUNITY): Payer: Medicare Other

## 2012-04-07 ENCOUNTER — Encounter (HOSPITAL_COMMUNITY): Payer: Medicare Other

## 2012-04-09 ENCOUNTER — Encounter (HOSPITAL_COMMUNITY): Payer: Medicare Other

## 2012-04-11 ENCOUNTER — Encounter (HOSPITAL_COMMUNITY): Payer: Medicare Other

## 2012-04-12 DIAGNOSIS — J019 Acute sinusitis, unspecified: Secondary | ICD-10-CM | POA: Diagnosis not present

## 2012-04-12 DIAGNOSIS — H8309 Labyrinthitis, unspecified ear: Secondary | ICD-10-CM | POA: Diagnosis not present

## 2012-04-12 DIAGNOSIS — I10 Essential (primary) hypertension: Secondary | ICD-10-CM | POA: Diagnosis not present

## 2012-04-12 DIAGNOSIS — IMO0002 Reserved for concepts with insufficient information to code with codable children: Secondary | ICD-10-CM | POA: Diagnosis not present

## 2012-04-14 ENCOUNTER — Encounter (HOSPITAL_COMMUNITY): Payer: Medicare Other

## 2012-04-16 ENCOUNTER — Encounter (HOSPITAL_COMMUNITY): Payer: Medicare Other

## 2012-04-18 ENCOUNTER — Encounter (HOSPITAL_COMMUNITY): Payer: Medicare Other

## 2012-04-21 ENCOUNTER — Encounter (HOSPITAL_COMMUNITY): Payer: Medicare Other

## 2012-04-23 ENCOUNTER — Encounter (HOSPITAL_COMMUNITY): Payer: Medicare Other

## 2012-04-25 ENCOUNTER — Encounter (HOSPITAL_COMMUNITY): Payer: Medicare Other

## 2012-04-28 ENCOUNTER — Encounter (HOSPITAL_COMMUNITY): Payer: Medicare Other

## 2012-04-30 ENCOUNTER — Encounter (HOSPITAL_COMMUNITY): Payer: Medicare Other

## 2012-05-02 ENCOUNTER — Encounter (HOSPITAL_COMMUNITY): Payer: Medicare Other

## 2012-05-08 ENCOUNTER — Telehealth (INDEPENDENT_AMBULATORY_CARE_PROVIDER_SITE_OTHER): Payer: Self-pay | Admitting: *Deleted

## 2012-05-08 DIAGNOSIS — Z8719 Personal history of other diseases of the digestive system: Secondary | ICD-10-CM

## 2012-05-08 NOTE — Telephone Encounter (Signed)
[facility] has requested a refill on Omeprazole DR 20 mg, take 1 capsule every morning.

## 2012-05-09 ENCOUNTER — Telehealth: Payer: Self-pay | Admitting: Cardiology

## 2012-05-09 MED ORDER — OMEPRAZOLE 20 MG PO CPDR: 20.0000 mg | DELAYED_RELEASE_CAPSULE | Freq: Every day | ORAL | Status: DC

## 2012-05-09 NOTE — Telephone Encounter (Signed)
LMTCB

## 2012-05-09 NOTE — Telephone Encounter (Signed)
Pt's daughter is unsure of pt's BP. She will try to check her BP and call back if it is low, otherwise she will follow-up with pt's PCP.

## 2012-05-09 NOTE — Telephone Encounter (Signed)
New Problem:     Patient's dauighter called in because she has a sinus infection and believe that her combinations of medications are giving her vertigo.  Please call back.

## 2012-05-09 NOTE — Telephone Encounter (Signed)
Pt's daughter will follow-up with pt's PCP for further evaluation of head and sinus congestion.

## 2012-05-09 NOTE — Telephone Encounter (Signed)
Pt's daughter states pt currently has head and sinus infection. She feels tired and weak with no energy. She denies tachycardia or palpitations. She does not feel she is going to pass out.

## 2012-05-09 NOTE — Telephone Encounter (Signed)
Follow-up:    Patient returned your call.  Please call back. 

## 2012-05-19 DIAGNOSIS — J343 Hypertrophy of nasal turbinates: Secondary | ICD-10-CM | POA: Diagnosis not present

## 2012-05-19 DIAGNOSIS — J0100 Acute maxillary sinusitis, unspecified: Secondary | ICD-10-CM | POA: Diagnosis not present

## 2012-05-19 DIAGNOSIS — J310 Chronic rhinitis: Secondary | ICD-10-CM | POA: Diagnosis not present

## 2012-05-19 DIAGNOSIS — H814 Vertigo of central origin: Secondary | ICD-10-CM | POA: Diagnosis not present

## 2012-05-19 DIAGNOSIS — H903 Sensorineural hearing loss, bilateral: Secondary | ICD-10-CM | POA: Diagnosis not present

## 2012-05-19 DIAGNOSIS — J342 Deviated nasal septum: Secondary | ICD-10-CM | POA: Diagnosis not present

## 2012-05-27 ENCOUNTER — Ambulatory Visit (INDEPENDENT_AMBULATORY_CARE_PROVIDER_SITE_OTHER): Payer: Medicare Other | Admitting: Cardiology

## 2012-05-27 ENCOUNTER — Encounter: Payer: Self-pay | Admitting: Cardiology

## 2012-05-27 VITALS — BP 131/80 | HR 82 | Ht 63.5 in | Wt 126.0 lb

## 2012-05-27 DIAGNOSIS — I359 Nonrheumatic aortic valve disorder, unspecified: Secondary | ICD-10-CM | POA: Diagnosis not present

## 2012-05-27 DIAGNOSIS — E785 Hyperlipidemia, unspecified: Secondary | ICD-10-CM | POA: Diagnosis not present

## 2012-05-27 DIAGNOSIS — I509 Heart failure, unspecified: Secondary | ICD-10-CM

## 2012-05-27 DIAGNOSIS — I25810 Atherosclerosis of coronary artery bypass graft(s) without angina pectoris: Secondary | ICD-10-CM | POA: Diagnosis not present

## 2012-05-27 DIAGNOSIS — I4891 Unspecified atrial fibrillation: Secondary | ICD-10-CM

## 2012-05-27 DIAGNOSIS — I2510 Atherosclerotic heart disease of native coronary artery without angina pectoris: Secondary | ICD-10-CM

## 2012-05-27 DIAGNOSIS — I350 Nonrheumatic aortic (valve) stenosis: Secondary | ICD-10-CM

## 2012-05-27 DIAGNOSIS — R42 Dizziness and giddiness: Secondary | ICD-10-CM

## 2012-05-27 LAB — LIPID PANEL
Cholesterol: 125 mg/dL (ref 0–200)
LDL Cholesterol: 38 mg/dL (ref 0–99)
VLDL: 24.4 mg/dL (ref 0.0–40.0)

## 2012-05-27 LAB — BASIC METABOLIC PANEL
BUN: 36 mg/dL — ABNORMAL HIGH (ref 6–23)
GFR: 34.87 mL/min — ABNORMAL LOW (ref >60.00)
Potassium: 3.8 mEq/L (ref 3.5–5.1)

## 2012-05-27 NOTE — Patient Instructions (Addendum)
Your physician recommends that you have  a FASTING lipid profile/BMET today--424.1  414.05   Your physician has requested that you have a carotid duplex. This test is an ultrasound of the carotid arteries in your neck. It looks at blood flow through these arteries that supply the brain with blood. Allow one hour for this exam. There are no restrictions or special instructions.  Your physician recommends that you schedule a follow-up appointment in: 4 months with Dr Aundra Dubin.

## 2012-05-27 NOTE — Assessment & Plan Note (Signed)
Well-seated bioprosthetic aortic valve on last echo in 8/12.  Needs antibiotics with dental work.

## 2012-05-27 NOTE — Assessment & Plan Note (Signed)
No chest pain.  Had LIMA-LAD with AVR in 8/12.  Continue ASA, Coreg, Crestor.

## 2012-05-27 NOTE — Assessment & Plan Note (Signed)
She has a mild degree of "dizziness" nearly constantly now.  Saw ENT with negative workup per her report.  She is not orthostatic.  I will get carotid dopplers to rule out significant carotid stenosis.  It is still certainly possible that the symptoms could be sinus-related.

## 2012-05-27 NOTE — Progress Notes (Signed)
PCP: Dr. Orson Ape  78 yo presents for followup after CABG-AVR.  Patient presented to [facility] with acute systolic CHF/pulmonary edema in 7/12.  Echo showed EF 20%, global hypokinesis.  There was severe aortic stenosis.  Left heart cath showed a calcified 80% mid LAD stenosis.  The cardiomyopathy was thought to be secondary to aortic stenosis.  Patient had CABG-AVR with LIMA-LAD and bioprosthetic AVR in 8/12.  She had post-operative atrial fibrillation that terminated with amiodarone and has not returned.  She was readmitted later in 8/12 with a large left pleural effusion and underwent thoracentesis.    Since that time, she has been doing well in general.  She completed cardiac rehab at [facility].  She is using a cane now when she leaves home.  No exertional dyspnea or chest pain.  Repeat echo after surgery showed EF 45-50%.  Main complaint has been "dizziness."  This has been fairly constant now since 1/13.  She has had some sinusitis symptoms.   The dizziness is not definitely positional.  She does not describe it as vertigo.  She saw an ENT last week who per her report did not find anything abnormal.  She has no weakness, numbness, paresthesias, etc.   We checked orthostatics today in the office: she was not orthostatic.    Labs (9/12): K 4.3, creatinine 1.2 Labs (10/12): K 4.6, creatinine 1.82 Labs (11/12): LDL 49, HDL 60 Labs (1/13): K 4.6, creatinine 1.6, BNP 435  Allergies: Lipitor (rash)  PMH: 1. CKD 2. Cardiomyopathy due to aortic stenosis.  Echo (7/12) with EF 20%, global hypokinesis, severe aortic stenosis.  Repeat Echo in 8/12 after AVR showed EF 45-50%, normal bioprosthetic aortic valve function.   3. Severe aortic stenosis: Bioprosthetic AVR in 8/12 (Edwards pericardial Magna-Ease #23).   4. Atrial fibrillation: post-operative after AVR in 8/12, converted to NSR with amiodarone.  5. CAD: LHC (8/12) with calcified plaque in LAD up to 80% stenosis in mid LAD, 50-60% serial distal LAD  stenosis. LIMA-LAD at time of AVR.  6. Hyperlipidemia 7. HTN 8. Erosive esophagitis 9. Breast cancer s/p bilateral partial mastectomies  SH: Lives in [place] alone, widowed, has 3 children (1 in [place]).  Nonsmoker, no ETOH.   FH: Mother with CVA at 88, father with MI at 53.   ROS: All systems reviewed and negative except as per HPI.    Current Outpatient Prescriptions  Medication Sig Dispense Refill  . acetaminophen (TYLENOL) 325 MG tablet Take 650 mg by mouth every 6 (six) hours as needed. For pain       . aspirin EC 325 MG tablet Take 325 mg by mouth daily.        . Calcium Carbonate (CALTRATE 600 PO) Take 1 tablet by mouth daily.        . carvedilol (COREG) 6.25 MG tablet Take 1 and 1/2 tablets twice a day  90 tablet  6  . diphenhydrAMINE (BENADRYL) 25 MG tablet Take 25 mg by mouth every 6 (six) hours as needed. For sleep      . ferrous gluconate (FERGON) 324 MG tablet Take 324 mg by mouth daily with breakfast.       . furosemide (LASIX) 40 MG tablet Take 1 tablet (40 mg total) by mouth daily.  90 tablet  3  . omeprazole (PRILOSEC) 20 MG capsule Take 1 capsule (20 mg total) by mouth daily.  30 capsule  5  . potassium chloride SA (KLOR-CON M20) 20 MEQ tablet Take 1 tablet (20  mEq total) by mouth daily.  90 tablet  3  . rosuvastatin (CRESTOR) 20 MG tablet Take 1 tablet (20 mg total) by mouth at bedtime.  90 tablet  3    BP 131/80  Pulse 82  Ht 5' 3.5" (1.613 m)  Wt 126 lb (57.153 kg)  BMI 21.97 kg/m2 General: NAD Neck: No JVD, no thyromegaly or thyroid nodule.  Lungs: Decreased breath sounds left base. CV: Nondisplaced PMI.  Heart regular S1/S2, no S3/S4, 1/6 early SEM. 1+ ankle edema.  No carotid bruit.  Normal pedal pulses.  Abdomen: Soft, nontender, no hepatosplenomegaly, no distention.  Neurologic: Alert and oriented x 3.  Psych: Normal affect. Extremities: No clubbing or cyanosis.

## 2012-05-27 NOTE — Assessment & Plan Note (Signed)
Goal LDL < 70.  I will have her check lipids.

## 2012-05-27 NOTE — Assessment & Plan Note (Signed)
Post-operative atrial fibrillation with no recurrence.  Now off amiodarone.

## 2012-05-27 NOTE — Assessment & Plan Note (Signed)
Initial dilated cardiomyopathy found in 7/12 likely secondary to severe aortic stenosis.  EF improved to 45-50% from 20% after AVR. She is on Coreg. Have not started ACEI due to CKD.  She appears near-euvolemic, so continue Lasix at current dose for now.  Will get BMET today.

## 2012-06-03 DIAGNOSIS — Z961 Presence of intraocular lens: Secondary | ICD-10-CM | POA: Diagnosis not present

## 2012-06-10 ENCOUNTER — Encounter (INDEPENDENT_AMBULATORY_CARE_PROVIDER_SITE_OTHER): Payer: Medicare Other

## 2012-06-10 DIAGNOSIS — R42 Dizziness and giddiness: Secondary | ICD-10-CM | POA: Diagnosis not present

## 2012-09-04 ENCOUNTER — Other Ambulatory Visit: Payer: Self-pay | Admitting: Cardiology

## 2012-09-08 ENCOUNTER — Encounter: Payer: Self-pay | Admitting: Cardiology

## 2012-09-08 ENCOUNTER — Ambulatory Visit (INDEPENDENT_AMBULATORY_CARE_PROVIDER_SITE_OTHER): Payer: Medicare Other | Admitting: Cardiology

## 2012-09-08 VITALS — BP 130/74 | HR 79 | Ht 63.0 in | Wt 130.0 lb

## 2012-09-08 DIAGNOSIS — I2510 Atherosclerotic heart disease of native coronary artery without angina pectoris: Secondary | ICD-10-CM

## 2012-09-08 DIAGNOSIS — I350 Nonrheumatic aortic (valve) stenosis: Secondary | ICD-10-CM

## 2012-09-08 DIAGNOSIS — I359 Nonrheumatic aortic valve disorder, unspecified: Secondary | ICD-10-CM

## 2012-09-08 DIAGNOSIS — I509 Heart failure, unspecified: Secondary | ICD-10-CM

## 2012-09-08 DIAGNOSIS — I25810 Atherosclerosis of coronary artery bypass graft(s) without angina pectoris: Secondary | ICD-10-CM

## 2012-09-08 DIAGNOSIS — I5022 Chronic systolic (congestive) heart failure: Secondary | ICD-10-CM | POA: Diagnosis not present

## 2012-09-08 DIAGNOSIS — I4891 Unspecified atrial fibrillation: Secondary | ICD-10-CM

## 2012-09-08 DIAGNOSIS — E785 Hyperlipidemia, unspecified: Secondary | ICD-10-CM

## 2012-09-08 LAB — BASIC METABOLIC PANEL
CO2: 29 mEq/L (ref 19–32)
Calcium: 10 mg/dL (ref 8.4–10.5)
GFR: 26.37 mL/min — ABNORMAL LOW (ref >60.00)
Sodium: 141 mEq/L (ref 135–145)

## 2012-09-08 NOTE — Assessment & Plan Note (Signed)
Post-operative atrial fibrillation with no recurrence.  Now off amiodarone.

## 2012-09-08 NOTE — Assessment & Plan Note (Signed)
Initial dilated cardiomyopathy found in 7/12 likely secondary to severe aortic stenosis.  EF improved to 45-50% from 20% after AVR. She is on Coreg. Have not started ACEI due to CKD.  She appears near-euvolemic, so continue Lasix at current dose for now.  Will get BMET today.  Given some issues with dizziness, I will hold off uptitration of Coreg for now.

## 2012-09-08 NOTE — Assessment & Plan Note (Signed)
Well-seated bioprosthetic aortic valve on last echo in 8/12.  Needs antibiotics with dental work.

## 2012-09-08 NOTE — Patient Instructions (Addendum)
The current medical regimen is effective;  continue present plan and medications.  Please have blood work today (BMP)  Follow up with Dr Aundra Dubin in 4 months.

## 2012-09-08 NOTE — Assessment & Plan Note (Signed)
Goal LDL < 70, continue current statin.

## 2012-09-08 NOTE — Progress Notes (Addendum)
Patient ID: [name], female   DOB: [birth year], 78 y.o.   MRN: [id number] PCP: Dr. Orson Ape  78 yo presents for followup after CABG-AVR.  Patient presented to [facility] with acute systolic CHF/pulmonary edema in 7/12.  Echo showed EF 20%, global hypokinesis.  There was severe aortic stenosis.  Left heart cath showed a calcified 80% mid LAD stenosis.  The cardiomyopathy was thought to be secondary to aortic stenosis.  Patient had CABG-AVR with LIMA-LAD and bioprosthetic AVR in 8/12.  She had post-operative atrial fibrillation that terminated with amiodarone and has not returned.  She was readmitted later in 8/12 with a large left pleural effusion and underwent thoracentesis.   Repeat echo after surgery showed EF 45-50%.   At last appointment, her main complaint was "dizziness" which seemed to be vertiginous.  She was not orthostatic.  The dizziness has greatly improved and is really not bothering her now.  Main issue recently has been falls. She has fallen 4 times in the last few months.  The falls have been mechanical (tripping while walking with her cane).  She is using her walker more.  No lightheadedness/presyncope/syncope.  No chest pain.  She has mild dyspnea if she walks fast but has been doing well in general from the standpoint of stamina and exercise capacity.    Labs (9/12): K 4.3, creatinine 1.2 Labs (10/12): K 4.6, creatinine 1.82 Labs (11/12): LDL 49, HDL 60 Labs (1/13): K 4.6, creatinine 1.6, BNP 435 Labs (5/13): K 3.8, creatinine 1.5, LDL 38, HDL 63  ECG: NSR, iRBBB, poor anterior R wave progression.   Allergies: Lipitor (rash)  PMH: 1. CKD 2. Cardiomyopathy due to aortic stenosis.  Echo (7/12) with EF 20%, global hypokinesis, severe aortic stenosis.  Repeat Echo in 8/12 after AVR showed EF 45-50%, normal bioprosthetic aortic valve function.   3. Severe aortic stenosis: Bioprosthetic AVR in 8/12 (Edwards pericardial Magna-Ease #23).   4. Atrial fibrillation: post-operative  after AVR in 8/12, converted to NSR with amiodarone.  5. CAD: LHC (8/12) with calcified plaque in LAD up to 80% stenosis in mid LAD, 50-60% serial distal LAD stenosis. LIMA-LAD at time of AVR.  6. Hyperlipidemia 7. HTN 8. Erosive esophagitis 9. Breast cancer s/p bilateral partial mastectomies 10. Carotid dopplers (6/13): minimal disease.   SH: Lives in [place] alone, widowed, has 3 children (1 in [place]).  Nonsmoker, no ETOH.   FH: Mother with CVA at 57, father with MI at 39.    Current Outpatient Prescriptions  Medication Sig Dispense Refill  . acetaminophen (TYLENOL) 325 MG tablet Take 650 mg by mouth every 6 (six) hours as needed. For pain       . aspirin EC 325 MG tablet Take 325 mg by mouth daily.        . Calcium Carbonate (CALTRATE 600 PO) Take 1 tablet by mouth daily.        . carvedilol (COREG) 6.25 MG tablet TAKE 1 AND 1/2 TABLET BY MOUTH TWICE A DAY  90 tablet  6  . ferrous gluconate (FERGON) 324 MG tablet Take 324 mg by mouth daily with breakfast.       . furosemide (LASIX) 40 MG tablet Take 1 tablet (40 mg total) by mouth daily.  90 tablet  3  . omeprazole (PRILOSEC) 20 MG capsule Take 1 capsule (20 mg total) by mouth daily.  30 capsule  5  . potassium chloride SA (KLOR-CON M20) 20 MEQ tablet Take 1 tablet (20 mEq total) by mouth  daily.  90 tablet  3  . rosuvastatin (CRESTOR) 20 MG tablet Take 1 tablet (20 mg total) by mouth at bedtime.  90 tablet  3    BP 130/74  Pulse 79  Ht 5\' 3"  (1.6 m)  Wt 130 lb (58.968 kg)  BMI 23.03 kg/m2 General: NAD Neck: No JVD, no thyromegaly or thyroid nodule.  Lungs: Clear bilaterally CV: Nondisplaced PMI.  Heart regular S1/S2, no S3/S4, 1/6 early SEM. Trace ankle edema.  No carotid bruit.  Normal pedal pulses.  Abdomen: Soft, nontender, no hepatosplenomegaly, no distention.  Neurologic: Alert and oriented x 3.  Psych: Normal affect. Extremities: No clubbing or cyanosis.

## 2012-09-08 NOTE — Assessment & Plan Note (Signed)
No chest pain.  Had LIMA-LAD with AVR in 8/12.  Continue ASA, Coreg, Crestor.

## 2012-09-10 ENCOUNTER — Other Ambulatory Visit: Payer: Self-pay | Admitting: Cardiology

## 2012-10-02 ENCOUNTER — Other Ambulatory Visit: Payer: Self-pay | Admitting: Cardiology

## 2012-11-09 ENCOUNTER — Other Ambulatory Visit (INDEPENDENT_AMBULATORY_CARE_PROVIDER_SITE_OTHER): Payer: Self-pay | Admitting: Internal Medicine

## 2012-11-18 ENCOUNTER — Other Ambulatory Visit: Payer: Self-pay | Admitting: *Deleted

## 2012-11-18 DIAGNOSIS — I359 Nonrheumatic aortic valve disorder, unspecified: Secondary | ICD-10-CM

## 2012-11-18 MED ORDER — ROSUVASTATIN CALCIUM 20 MG PO TABS: 20.0000 mg | ORAL_TABLET | Freq: Every day | ORAL | Status: DC

## 2012-11-21 DIAGNOSIS — I2510 Atherosclerotic heart disease of native coronary artery without angina pectoris: Secondary | ICD-10-CM | POA: Diagnosis not present

## 2012-11-21 DIAGNOSIS — M549 Dorsalgia, unspecified: Secondary | ICD-10-CM | POA: Diagnosis not present

## 2012-11-21 DIAGNOSIS — N390 Urinary tract infection, site not specified: Secondary | ICD-10-CM | POA: Diagnosis not present

## 2012-11-21 DIAGNOSIS — Z23 Encounter for immunization: Secondary | ICD-10-CM | POA: Diagnosis not present

## 2012-11-21 DIAGNOSIS — I10 Essential (primary) hypertension: Secondary | ICD-10-CM | POA: Diagnosis not present

## 2012-11-21 DIAGNOSIS — IMO0002 Reserved for concepts with insufficient information to code with codable children: Secondary | ICD-10-CM | POA: Diagnosis not present

## 2013-03-05 ENCOUNTER — Ambulatory Visit: Payer: Medicare Other | Admitting: Cardiology

## 2013-03-06 ENCOUNTER — Ambulatory Visit: Payer: Medicare Other | Admitting: Cardiology

## 2013-03-29 ENCOUNTER — Other Ambulatory Visit: Payer: Self-pay | Admitting: Cardiology

## 2013-04-03 ENCOUNTER — Ambulatory Visit (INDEPENDENT_AMBULATORY_CARE_PROVIDER_SITE_OTHER): Payer: Medicare Other | Admitting: Cardiology

## 2013-04-03 ENCOUNTER — Encounter: Payer: Self-pay | Admitting: Cardiology

## 2013-04-03 VITALS — BP 100/66 | HR 69 | Ht 63.0 in | Wt 124.0 lb

## 2013-04-03 DIAGNOSIS — I359 Nonrheumatic aortic valve disorder, unspecified: Secondary | ICD-10-CM | POA: Diagnosis not present

## 2013-04-03 DIAGNOSIS — I509 Heart failure, unspecified: Secondary | ICD-10-CM | POA: Diagnosis not present

## 2013-04-03 DIAGNOSIS — I350 Nonrheumatic aortic (valve) stenosis: Secondary | ICD-10-CM

## 2013-04-03 DIAGNOSIS — E785 Hyperlipidemia, unspecified: Secondary | ICD-10-CM

## 2013-04-03 DIAGNOSIS — I25810 Atherosclerosis of coronary artery bypass graft(s) without angina pectoris: Secondary | ICD-10-CM | POA: Diagnosis not present

## 2013-04-03 DIAGNOSIS — I2510 Atherosclerotic heart disease of native coronary artery without angina pectoris: Secondary | ICD-10-CM | POA: Diagnosis not present

## 2013-04-03 LAB — BASIC METABOLIC PANEL
BUN: 42 mg/dL — ABNORMAL HIGH (ref 6–23)
CO2: 27 mEq/L (ref 19–32)
Calcium: 9.8 mg/dL (ref 8.4–10.5)
Creatinine, Ser: 2.5 mg/dL — ABNORMAL HIGH (ref 0.4–1.2)
Glucose, Bld: 95 mg/dL (ref 70–99)

## 2013-04-03 LAB — LIPID PANEL
HDL: 47.1 mg/dL (ref >39.00)
Total CHOL/HDL Ratio: 3
Triglycerides: 111 mg/dL (ref 0.0–149.0)

## 2013-04-03 MED ORDER — CARVEDILOL 6.25 MG PO TABS: 6.2500 mg | ORAL_TABLET | Freq: Two times a day (BID) | ORAL | Status: DC

## 2013-04-03 NOTE — Patient Instructions (Addendum)
Decrease coreg(carvedilol) to 6.25mg  two times a day.   Your physician recommends that you have a FASTING lipid profile /BMET today.  Your physician wants you to follow-up in: 6 months with Dr Aundra Dubin. (October 2014). You will receive a reminder letter in the mail two months in advance. If you don't receive a letter, please call our office to schedule the follow-up appointment.

## 2013-04-05 NOTE — Progress Notes (Signed)
Patient ID: [name], female   DOB: 1922-11-13, 79 y.o.   MRN: [id number] PCP: Dr. Orson Ape  79 yo presents for followup after CABG-AVR.  Patient presented to [facility] with acute systolic CHF/pulmonary edema in 7/12.  Echo showed EF 20%, global hypokinesis.  There was severe aortic stenosis.  Left heart cath showed a calcified 80% mid LAD stenosis.  The cardiomyopathy was thought to be secondary to aortic stenosis.  Patient had CABG-AVR with LIMA-LAD and bioprosthetic AVR in 8/12.  She had post-operative atrial fibrillation that terminated with amiodarone and has not returned.  She was readmitted later in 8/12 with a large left pleural effusion and underwent thoracentesis.   Repeat echo after surgery showed EF 45-50%.   She continues to have trouble with balance and uses a cane or walker.  No recent falls.  She is occasionally lightheaded, especially in the morning.  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  She still lives alone and drives.  No chest pain.  Weight is down 6 lbs since last appointment.    Labs (9/12): K 4.3, creatinine 1.2 Labs (10/12): K 4.6, creatinine 1.82 Labs (11/12): LDL 49, HDL 60 Labs (1/13): K 4.6, creatinine 1.6, BNP 435 Labs (5/13): K 3.8, creatinine 1.5, LDL 38, HDL 63 Labs (9/13): K 3.8, creatinine 1.9  ECG: NSR, iRBBB, poor anterior R wave progression.   Allergies: Lipitor (rash)  PMH: 1. CKD 2. Cardiomyopathy due to aortic stenosis.  Echo (7/12) with EF 20%, global hypokinesis, severe aortic stenosis.  Repeat Echo in 8/12 after AVR showed EF 45-50%, normal bioprosthetic aortic valve function.   3. Severe aortic stenosis: Bioprosthetic AVR in 8/12 (Edwards pericardial Magna-Ease #23).   4. Atrial fibrillation: post-operative after AVR in 8/12, converted to NSR with amiodarone.  5. CAD: LHC (8/12) with calcified plaque in LAD up to 80% stenosis in mid LAD, 50-60% serial distal LAD stenosis. LIMA-LAD at time of AVR.  6.  Hyperlipidemia 7. HTN 8. Erosive esophagitis 9. Breast cancer s/p bilateral partial mastectomies 10. Carotid dopplers (6/13): minimal disease.   SH: Lives in [place] alone, widowed, has 3 children (1 in [place]).  Nonsmoker, no ETOH.   FH: Mother with CVA at 25, father with MI at 15.   ROS: All systems reviewed and negative except as per HPI.   Current Outpatient Prescriptions  Medication Sig Dispense Refill  . acetaminophen (TYLENOL) 325 MG tablet Take 650 mg by mouth every 6 (six) hours as needed. For pain       . aspirin EC 325 MG tablet Take 325 mg by mouth daily.        . Calcium Carbonate (CALTRATE 600 PO) Take 1 tablet by mouth daily.        . carvedilol (COREG) 6.25 MG tablet Take 1 tablet (6.25 mg total) by mouth 2 (two) times daily.  180 tablet  3  . ferrous gluconate (FERGON) 324 MG tablet Take 324 mg by mouth daily with breakfast.       . furosemide (LASIX) 40 MG tablet take 1 tablet by mouth once daily  30 tablet  9  . KLOR-CON M20 20 MEQ tablet take 1 tablet by mouth once daily  90 tablet  3  . omeprazole (PRILOSEC) 20 MG capsule take 1 capsule by mouth once daily  30 capsule  5  . rosuvastatin (CRESTOR) 20 MG tablet Take 1 tablet (20 mg total) by mouth at bedtime.  90 tablet  1  No current facility-administered medications for this visit.    BP 100/66  Pulse 69  Ht 5\' 3"  (1.6 m)  Wt 124 lb (56.246 kg)  BMI 21.97 kg/m2 General: NAD Neck: No JVD, no thyromegaly or thyroid nodule.  Lungs: Clear bilaterally CV: Nondisplaced PMI.  Heart regular S1/S2, no S3/S4, 1/6 early SEM. No edema.  No carotid bruit.  Normal pedal pulses.  Abdomen: Soft, nontender, no hepatosplenomegaly, no distention.  Neurologic: Alert and oriented x 3.  Psych: Normal affect. Extremities: No clubbing or cyanosis.   Assessment/Plan: 1. Status post aortic valve replacement for severe AS.  Last echo in 8/12 showed stable bioprosthetic valve and symptoms have not changed significantly.   2. CAD: Stable s/p LIMA-LAD at time of AVR.  No chest pain.  Continue ASA, Coreg, statin.  3. Cardiomyopathy: EF improved to 45-50% after AVR.  NYHA class II symptoms with no evidence for volume overload.  Continue current dose of Lasix.  Given occasional lightheadedness, I will decrease Coreg to 6.25 mg bid.  No ACEI given significant CKD. 4. CKD: Last creatinine was 1.9.  Will repeat BMET.  5. Hyperlipidemia: Check lipids today, continue Crestor.   [name] 04/05/2013

## 2013-04-06 ENCOUNTER — Telehealth: Payer: Self-pay | Admitting: *Deleted

## 2013-04-06 DIAGNOSIS — E785 Hyperlipidemia, unspecified: Secondary | ICD-10-CM

## 2013-04-06 DIAGNOSIS — I509 Heart failure, unspecified: Secondary | ICD-10-CM

## 2013-04-06 DIAGNOSIS — I4891 Unspecified atrial fibrillation: Secondary | ICD-10-CM

## 2013-04-06 NOTE — Telephone Encounter (Signed)
Called patient with lab results. No changes made by MD but patient is to repeat BMET in one month. Patient agreed and will come in on Monday, May 5th to have lab draw.

## 2013-04-06 NOTE — Progress Notes (Signed)
Quick Note:  Called patient with lab results. No changes made by MD but patient is to repeat BMET in one month. Patient agreed and will come in on Monday, May 5th to have lab draw. ______

## 2013-05-05 ENCOUNTER — Other Ambulatory Visit (INDEPENDENT_AMBULATORY_CARE_PROVIDER_SITE_OTHER): Payer: Medicare Other

## 2013-05-05 DIAGNOSIS — I509 Heart failure, unspecified: Secondary | ICD-10-CM | POA: Diagnosis not present

## 2013-05-05 DIAGNOSIS — E785 Hyperlipidemia, unspecified: Secondary | ICD-10-CM | POA: Diagnosis not present

## 2013-05-05 DIAGNOSIS — I4891 Unspecified atrial fibrillation: Secondary | ICD-10-CM | POA: Diagnosis not present

## 2013-05-05 DIAGNOSIS — Z961 Presence of intraocular lens: Secondary | ICD-10-CM | POA: Diagnosis not present

## 2013-05-05 LAB — BASIC METABOLIC PANEL
BUN: 42 mg/dL — ABNORMAL HIGH (ref 6–23)
Calcium: 9.8 mg/dL (ref 8.4–10.5)
Creatinine, Ser: 2.6 mg/dL — ABNORMAL HIGH (ref 0.4–1.2)
GFR: 18.2 mL/min — ABNORMAL LOW (ref >60.00)

## 2013-05-06 ENCOUNTER — Other Ambulatory Visit: Payer: Self-pay | Admitting: *Deleted

## 2013-05-07 ENCOUNTER — Other Ambulatory Visit (INDEPENDENT_AMBULATORY_CARE_PROVIDER_SITE_OTHER): Payer: Self-pay | Admitting: Internal Medicine

## 2013-05-07 ENCOUNTER — Other Ambulatory Visit: Payer: Self-pay | Admitting: Cardiology

## 2013-05-08 IMAGING — CR DG CHEST 1V PORT
1 series · 1 of 1 positions shown · non-contrast
Comparison: 07/28/2011, earlier the same day

CLINICAL DATA: CHF.

PORTABLE CHEST - 1 VIEW

[view not recorded]
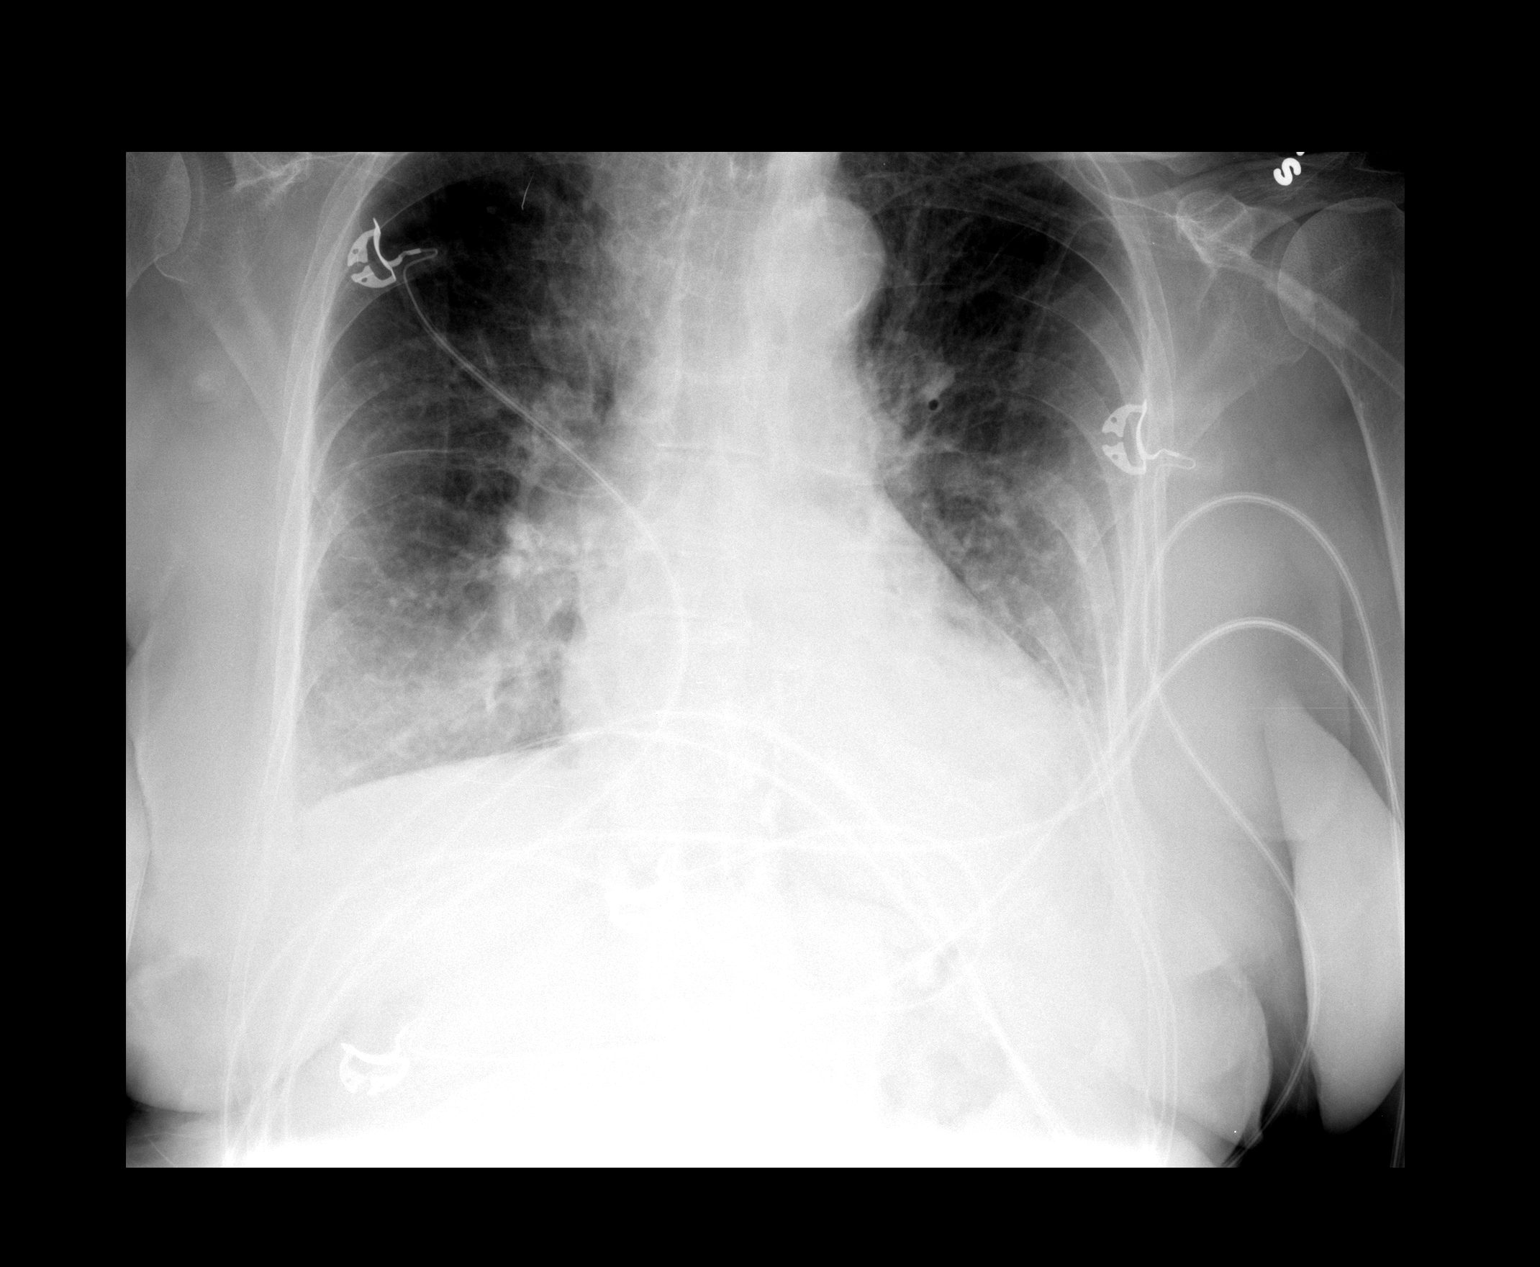

[1 of 1 positions shown; findings below may reference images not displayed]

FINDINGS: The cardiopericardial silhouette is enlarged.
Interstitial and basilar alveolar opacity persist, without
substantial interval change. Telemetry leads overlie the chest.
IMPRESSION: No substantial interval change.

## 2013-05-23 IMAGING — CR DG CHEST 1V PORT
1 series · 1 of 1 positions shown · non-contrast
Comparison: 08/11/2011

CLINICAL DATA: CABG.

PORTABLE CHEST - 1 VIEW

[AP]
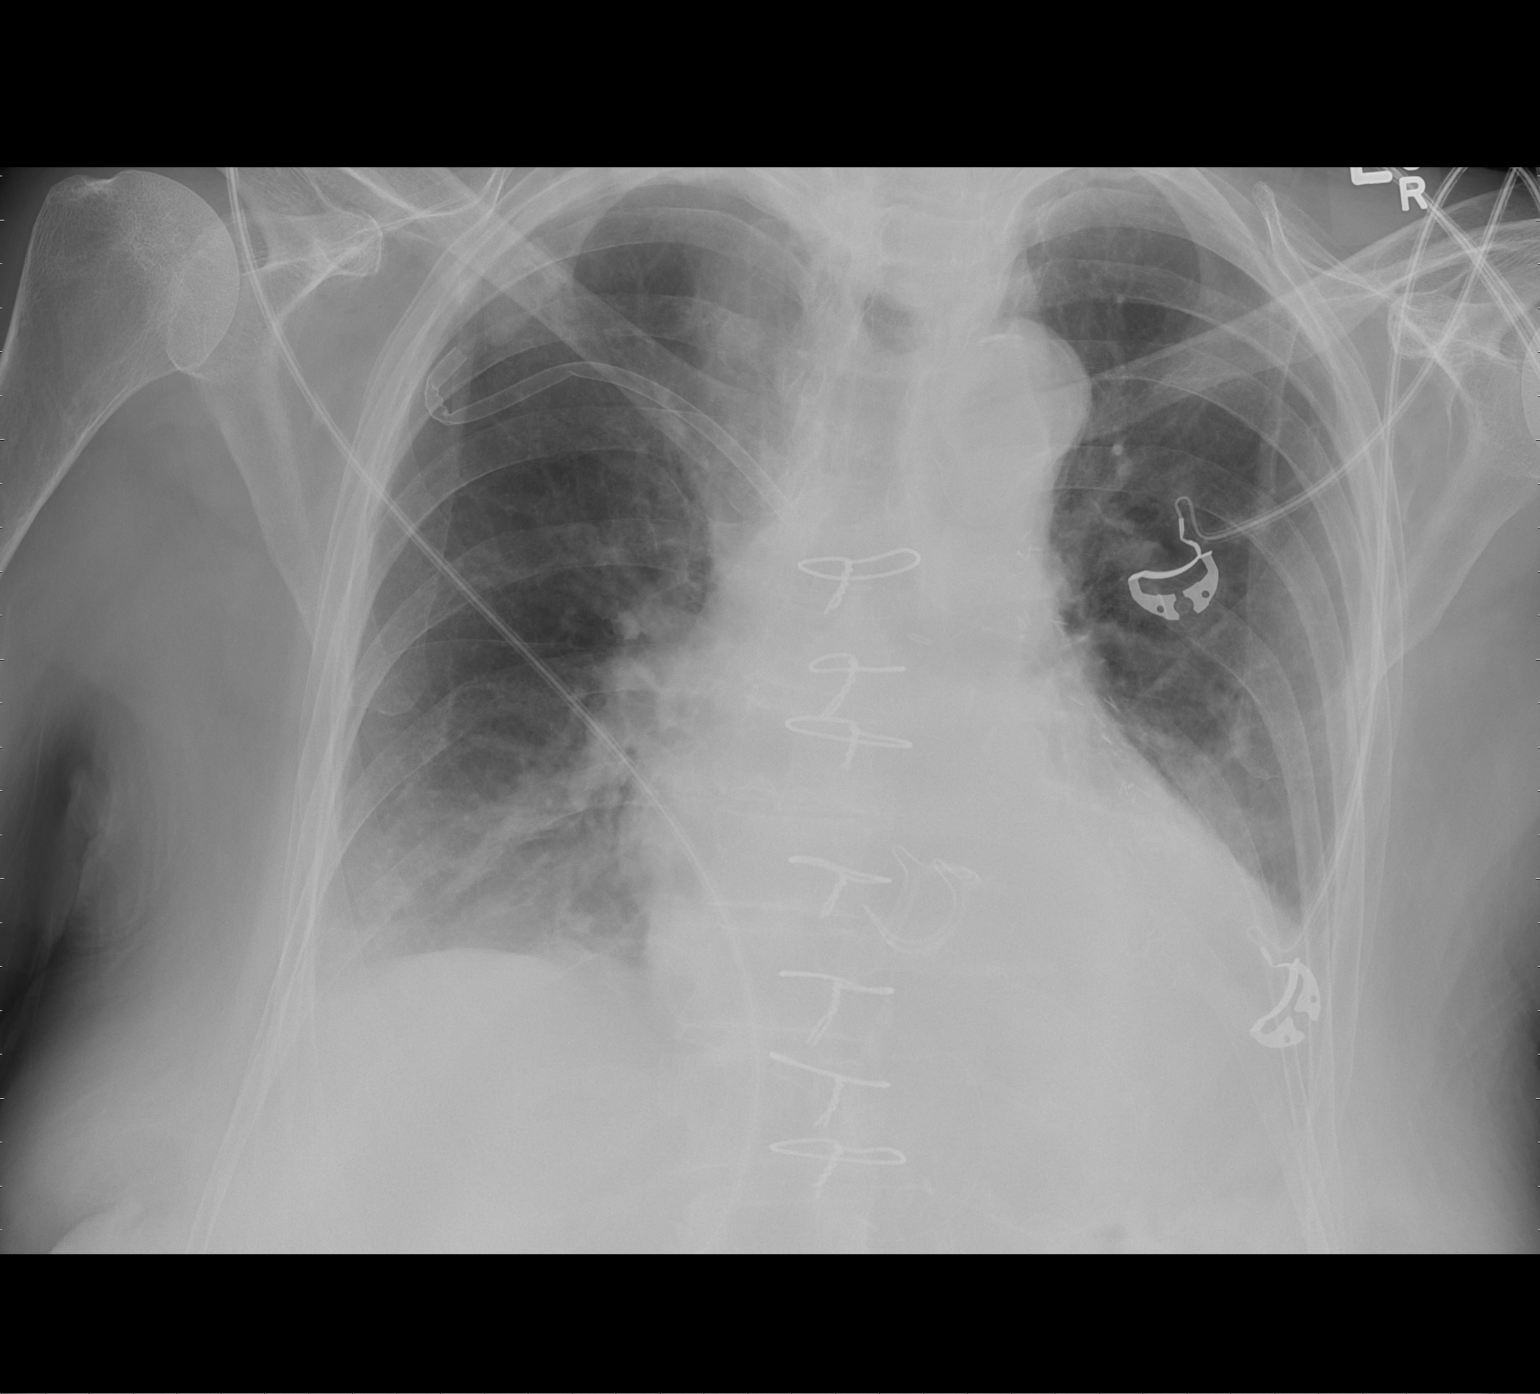

[1 of 1 positions shown; findings below may reference images not displayed]

FINDINGS: Low lung volumes.  Left basilar atelectasis.  Suspected
small right pleural effusion.

Stable cardiomegaly. Postsurgical changes related to prior CABG.
Valve annuloplasty.

Interval removal of the left chest tube, mediastinal drain, and
right subclavian Swan-Ganz catheter.  Stable right subclavian
venous sheath.

No pneumothorax is seen.
IMPRESSION: Left basilar atelectasis with suspected small right pleural
effusion.

Interval removal of left chest tube and mediastinal drain.  No
pneumothorax is seen.

## 2013-05-26 IMAGING — CR DG CHEST 2V
2 series · 2 of 2 positions shown · non-contrast
Comparison: Chest 08/12/2011.

CLINICAL DATA: Status post CABG.  Shortness of breath.

CHEST - 2 VIEW

[w chest pa]
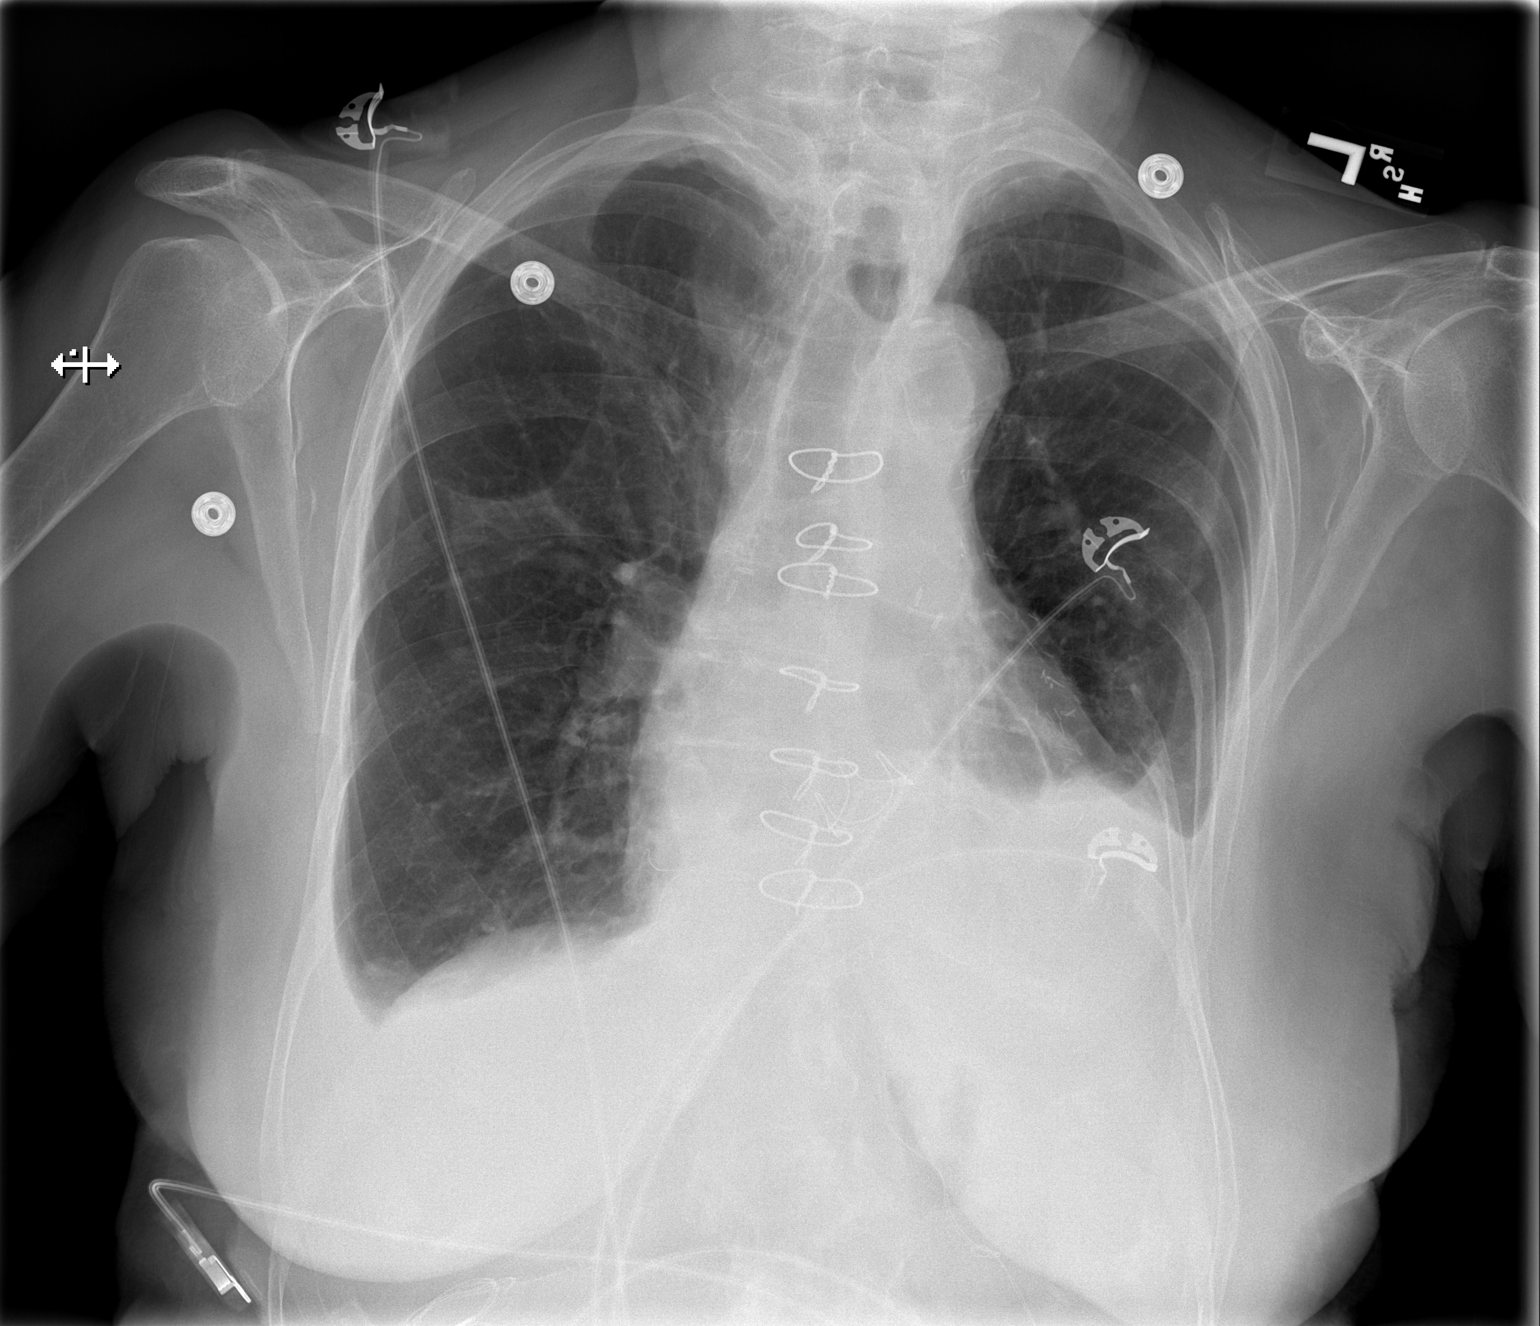

[w chest lat]
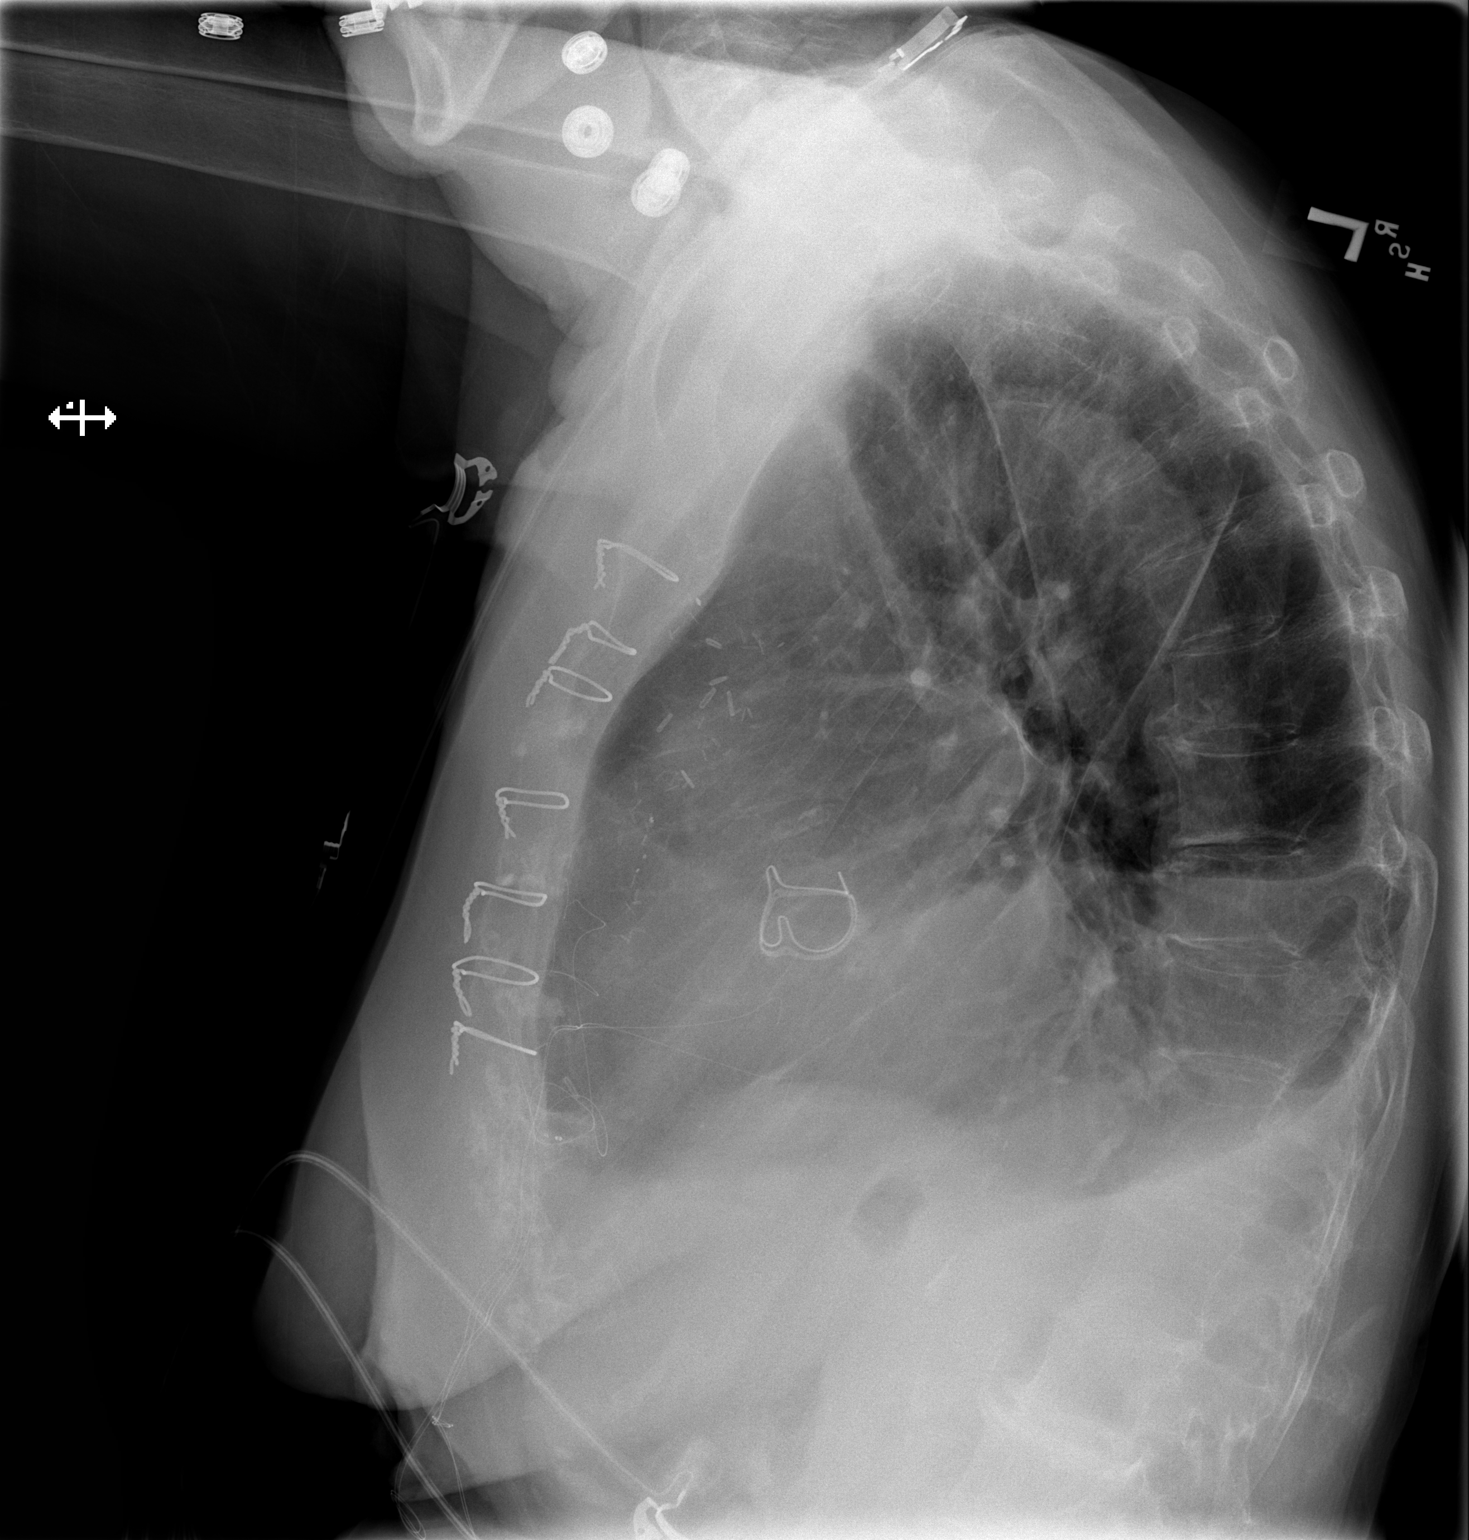

[2 of 2 positions shown; findings below may reference images not displayed]

FINDINGS: Small bilateral pleural effusions and basilar
atelectasis, greater on the left, persists without marked change.
The patient is status post CABG and aortic valve replacement.
There is no pulmonary edema.  Compressive basilar atelectasis is
noted.  Heart size normal.
IMPRESSION: No marked change in small bilateral pleural effusions and basilar
atelectasis, greater on the left.

## 2013-06-08 IMAGING — CR DG CHEST 1V
1 series · 1 of 1 positions shown · non-contrast
Comparison: Chest x-ray 08/27/2011.

CLINICAL DATA: Status post left-sided thoracentesis.

CHEST - 1 VIEW

[w chest pa]
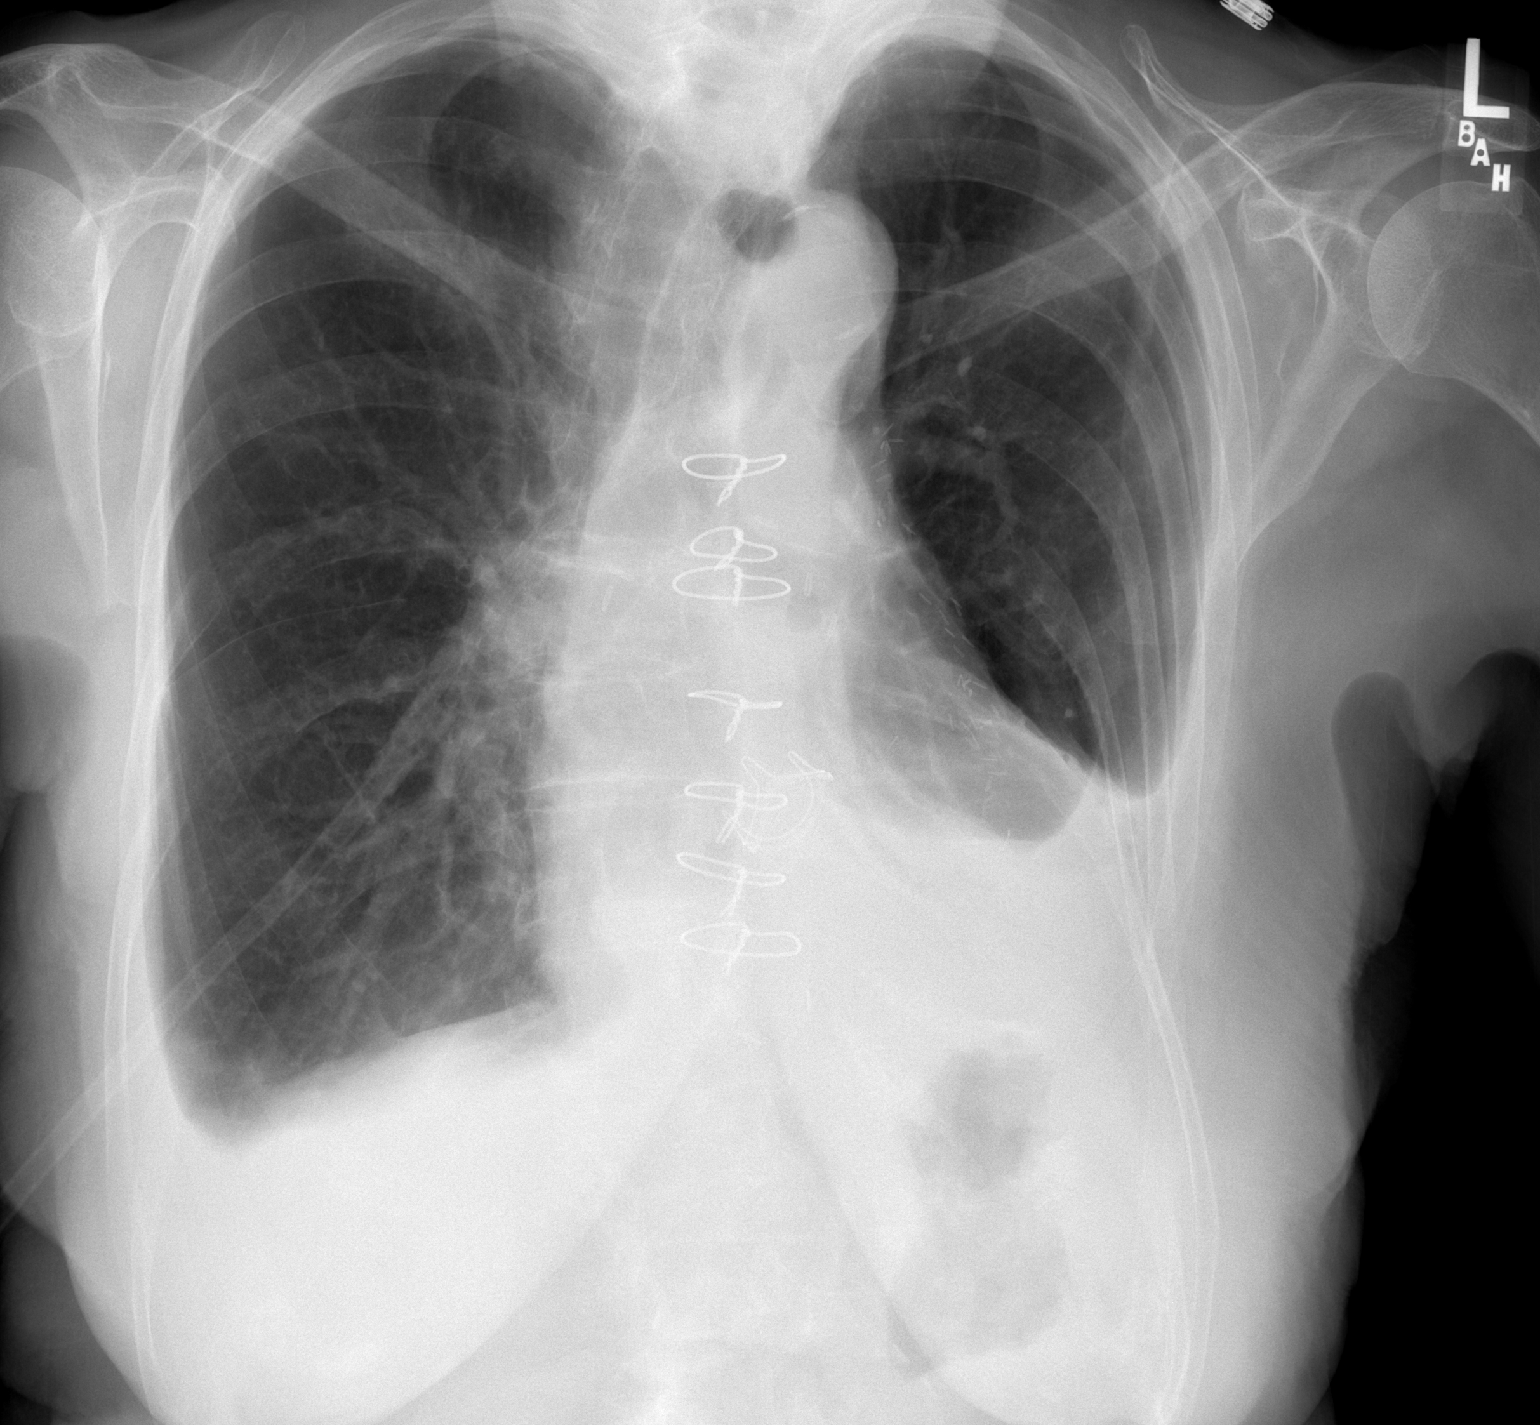

[1 of 1 positions shown; findings below may reference images not displayed]

FINDINGS: Interval decrease in size of the left-sided pleural
effusion.  No postprocedural pneumothorax is identified.  A small
right pleural effusion is again noted.  Slight improved right
basilar aeration.
IMPRESSION: Decrease in size of the left-sided pleural effusion and no
postprocedural pneumothorax

## 2013-06-29 ENCOUNTER — Telehealth: Payer: Self-pay | Admitting: Cardiology

## 2013-06-29 NOTE — Telephone Encounter (Signed)
New problem    Lasix every other day . Weight has stay the same.     C/O some days dirrehea  What can she takes .

## 2013-06-29 NOTE — Telephone Encounter (Signed)
Spoke with patient's daughter. She states pt has been having diarrhea every 3rd day for a couple of weeks. She has not had a problem with diarrhea in the past and otherwise feels OK. I recommended pt contact her PCP for further recommendations.

## 2013-06-29 NOTE — Telephone Encounter (Signed)
LMTCB

## 2013-08-03 ENCOUNTER — Telehealth (INDEPENDENT_AMBULATORY_CARE_PROVIDER_SITE_OTHER): Payer: Self-pay | Admitting: *Deleted

## 2013-08-03 NOTE — Telephone Encounter (Signed)
I called the [employer] for an approval for Omeprazole 20 mg Capsule. Per Gregary Signs at [employer] the patient has been approved from 07-13-13/08-03-14.\The Case ID # is [id number]. I have called the [facility] /[place]

## 2013-08-14 ENCOUNTER — Telehealth: Payer: Self-pay

## 2013-08-14 NOTE — Telephone Encounter (Signed)
Pt called about her insurance not approving her Crestor. [place] told her they had sent Aundra Dubin "something" about the Crestor but they have not heard back from him. I told pt I would forward this msg to his nurse and to [facility] for review and then one of them will call her back to let her know what is going on.

## 2013-08-14 NOTE — Telephone Encounter (Signed)
Webb Silversmith, have you seen this?

## 2013-08-17 ENCOUNTER — Telehealth: Payer: Self-pay | Admitting: Cardiology

## 2013-08-17 MED ORDER — ROSUVASTATIN CALCIUM 10 MG PO TABS: 10.0000 mg | ORAL_TABLET | Freq: Every day | ORAL | Status: DC

## 2013-08-17 NOTE — Telephone Encounter (Signed)
Dr Aundra Dubin reviewed records. He will prescribe crestor 10mg  daily. Pt's daughter advised and will notify patient. New prescription to pharmacy.

## 2013-08-17 NOTE — Telephone Encounter (Signed)
See phone note 08/17/13

## 2013-08-17 NOTE — Telephone Encounter (Signed)
Pt's dtr calling dosage on crestor 20mg , after the age of 89 medicare will no longer cover crestor at 20mg , they said it is a toxic level to someone of her age and it needs to be cut in half, pls let dtr know if can be changed and will need new rx

## 2013-08-17 NOTE — Telephone Encounter (Signed)
I will forward to Dr Aundra Dubin.

## 2013-08-17 NOTE — Telephone Encounter (Signed)
Dr Aundra Dubin has reviewed. He recommended pt decrease crestor to 10mg  daily.

## 2013-09-07 DIAGNOSIS — N390 Urinary tract infection, site not specified: Secondary | ICD-10-CM | POA: Diagnosis not present

## 2013-09-07 DIAGNOSIS — N184 Chronic kidney disease, stage 4 (severe): Secondary | ICD-10-CM | POA: Diagnosis not present

## 2013-09-07 DIAGNOSIS — IMO0002 Reserved for concepts with insufficient information to code with codable children: Secondary | ICD-10-CM | POA: Diagnosis not present

## 2013-09-07 DIAGNOSIS — Z23 Encounter for immunization: Secondary | ICD-10-CM | POA: Diagnosis not present

## 2013-09-07 DIAGNOSIS — I2510 Atherosclerotic heart disease of native coronary artery without angina pectoris: Secondary | ICD-10-CM | POA: Diagnosis not present

## 2013-09-08 ENCOUNTER — Telehealth: Payer: Self-pay | Admitting: Cardiology

## 2013-09-08 NOTE — Telephone Encounter (Signed)
Spoke with patient's daughter, Bethena Roys. Pt had lab yesterday at her PCP, he was going to fax results of lab done yesterday to Dr Aundra Dubin. He was also going to refer her to a kidney specialist in [place].

## 2013-09-08 NOTE — Telephone Encounter (Signed)
New problem    Patient saw PCP on yesterday ,has a  Bad bladder infection.  Kidney not doing so well. Going to a kidney specialist. On Cipro 500 mg for 5 days. Blood work results & spectrum results will be fax over to Dr. Aundra Dubin.

## 2013-09-11 ENCOUNTER — Other Ambulatory Visit: Payer: Self-pay | Admitting: Cardiology

## 2013-09-15 DIAGNOSIS — N184 Chronic kidney disease, stage 4 (severe): Secondary | ICD-10-CM | POA: Diagnosis not present

## 2013-09-28 DIAGNOSIS — N390 Urinary tract infection, site not specified: Secondary | ICD-10-CM | POA: Diagnosis not present

## 2013-10-03 DIAGNOSIS — D649 Anemia, unspecified: Secondary | ICD-10-CM | POA: Diagnosis not present

## 2013-10-03 DIAGNOSIS — N184 Chronic kidney disease, stage 4 (severe): Secondary | ICD-10-CM | POA: Diagnosis not present

## 2013-10-03 DIAGNOSIS — I10 Essential (primary) hypertension: Secondary | ICD-10-CM | POA: Diagnosis not present

## 2013-10-03 DIAGNOSIS — N139 Obstructive and reflux uropathy, unspecified: Secondary | ICD-10-CM | POA: Diagnosis not present

## 2013-10-05 ENCOUNTER — Other Ambulatory Visit (HOSPITAL_COMMUNITY): Payer: Self-pay | Admitting: Nephrology

## 2013-10-05 DIAGNOSIS — N289 Disorder of kidney and ureter, unspecified: Secondary | ICD-10-CM

## 2013-10-20 ENCOUNTER — Encounter: Payer: Self-pay | Admitting: Cardiology

## 2013-10-20 ENCOUNTER — Ambulatory Visit (HOSPITAL_COMMUNITY)
Admission: RE | Admit: 2013-10-20 | Discharge: 2013-10-20 | Disposition: A | Discharge status: Home / Self Care | Payer: Medicare Other | Source: Ambulatory Visit | Attending: Nephrology | Admitting: Nephrology

## 2013-10-20 DIAGNOSIS — E559 Vitamin D deficiency, unspecified: Secondary | ICD-10-CM | POA: Diagnosis not present

## 2013-10-20 DIAGNOSIS — Z79899 Other long term (current) drug therapy: Secondary | ICD-10-CM | POA: Diagnosis not present

## 2013-10-20 DIAGNOSIS — R9389 Abnormal findings on diagnostic imaging of other specified body structures: Secondary | ICD-10-CM | POA: Insufficient documentation

## 2013-10-20 DIAGNOSIS — N189 Chronic kidney disease, unspecified: Secondary | ICD-10-CM | POA: Diagnosis not present

## 2013-10-20 DIAGNOSIS — R809 Proteinuria, unspecified: Secondary | ICD-10-CM | POA: Diagnosis not present

## 2013-10-20 DIAGNOSIS — N289 Disorder of kidney and ureter, unspecified: Secondary | ICD-10-CM

## 2013-10-20 DIAGNOSIS — N269 Renal sclerosis, unspecified: Secondary | ICD-10-CM | POA: Diagnosis not present

## 2013-10-20 DIAGNOSIS — I10 Essential (primary) hypertension: Secondary | ICD-10-CM | POA: Diagnosis not present

## 2013-10-20 DIAGNOSIS — D649 Anemia, unspecified: Secondary | ICD-10-CM | POA: Diagnosis not present

## 2013-10-23 ENCOUNTER — Ambulatory Visit (INDEPENDENT_AMBULATORY_CARE_PROVIDER_SITE_OTHER): Payer: Medicare Other | Admitting: Cardiology

## 2013-10-23 VITALS — BP 140/80 | HR 78 | Wt 130.0 lb

## 2013-10-23 DIAGNOSIS — I359 Nonrheumatic aortic valve disorder, unspecified: Secondary | ICD-10-CM | POA: Diagnosis not present

## 2013-10-23 DIAGNOSIS — I2510 Atherosclerotic heart disease of native coronary artery without angina pectoris: Secondary | ICD-10-CM | POA: Diagnosis not present

## 2013-10-23 DIAGNOSIS — I4891 Unspecified atrial fibrillation: Secondary | ICD-10-CM | POA: Diagnosis not present

## 2013-10-23 DIAGNOSIS — I350 Nonrheumatic aortic (valve) stenosis: Secondary | ICD-10-CM

## 2013-10-23 DIAGNOSIS — E785 Hyperlipidemia, unspecified: Secondary | ICD-10-CM

## 2013-10-23 DIAGNOSIS — I509 Heart failure, unspecified: Secondary | ICD-10-CM

## 2013-10-23 NOTE — Patient Instructions (Signed)
Your physician wants you to follow-up in: 6 months with Dr Aundra Dubin. (April 2015). You will receive a reminder letter in the mail two months in advance. If you don't receive a letter, please call our office to schedule the follow-up appointment.

## 2013-10-25 ENCOUNTER — Encounter: Payer: Self-pay | Admitting: Cardiology

## 2013-10-25 NOTE — Progress Notes (Signed)
Patient ID: [name], female   DOB: May 06, 1922, 79 y.o.   MRN: [id number] PCP: Dr. Orson Ape  79 yo presents for followup after CABG-AVR.  Patient presented to [facility] with acute systolic CHF/pulmonary edema in 7/12.  Echo showed EF 20%, global hypokinesis.  There was severe aortic stenosis.  Left heart cath showed a calcified 80% mid LAD stenosis.  The cardiomyopathy was thought to be secondary to aortic stenosis.  Patient had CABG-AVR with LIMA-LAD and bioprosthetic AVR in 8/12.  She had post-operative atrial fibrillation that terminated with amiodarone and has not returned.  She was readmitted later in 8/12 with a large left pleural effusion and underwent thoracentesis.   Repeat echo after surgery showed EF 45-50%.   She continues to have trouble with balance and uses a cane or walker.  No recent falls.  She is occasionally lightheaded with standing, especially in the morning.  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  She still lives alone and drives.  No chest pain.  Creatinine has been higher recently.  Lasix was decreased to every other day.  She sees Dr. Lowanda Foster for nephrology.   Labs (9/12): K 4.3, creatinine 1.2 Labs (10/12): K 4.6, creatinine 1.82 Labs (11/12): LDL 49, HDL 60 Labs (1/13): K 4.6, creatinine 1.6, BNP 435 Labs (5/13): K 3.8, creatinine 1.5, LDL 38, HDL 63 Labs (9/13): K 3.8, creatinine 1.9 Labs (4/14): LDL 54, HDL 47 Labs (5/14): K 4, creatinine 2.6  ECG: NSR, iRBBB, inferior Qs, poor anterior R wave progression.   Allergies: Lipitor (rash)  PMH: 1. CKD 2. Cardiomyopathy due to aortic stenosis.  Echo (7/12) with EF 20%, global hypokinesis, severe aortic stenosis.  Repeat Echo in 8/12 after AVR showed EF 45-50%, normal bioprosthetic aortic valve function.   3. Severe aortic stenosis: Bioprosthetic AVR in 8/12 (Edwards pericardial Magna-Ease #23).   4. Atrial fibrillation: post-operative after AVR in 8/12, converted to NSR with  amiodarone.  5. CAD: LHC (8/12) with calcified plaque in LAD up to 80% stenosis in mid LAD, 50-60% serial distal LAD stenosis. LIMA-LAD at time of AVR.  6. Hyperlipidemia 7. HTN 8. Erosive esophagitis 9. Breast cancer s/p bilateral partial mastectomies 10. Carotid dopplers (6/13): minimal disease.   SH: Lives in [place] alone, widowed, has 3 children (1 in [place]).  Nonsmoker, no ETOH.   FH: Mother with CVA at 49, father with MI at 49.   Current Outpatient Prescriptions  Medication Sig Dispense Refill  . acetaminophen (TYLENOL) 325 MG tablet Take 650 mg by mouth every 6 (six) hours as needed. For pain       . aspirin EC 325 MG tablet Take 325 mg by mouth daily.        . Calcium Carbonate (CALTRATE 600 PO) Take 1 tablet by mouth daily.        . carvedilol (COREG) 6.25 MG tablet Take 1 tablet (6.25 mg total) by mouth 2 (two) times daily.  180 tablet  3  . ferrous gluconate (FERGON) 324 MG tablet Take 324 mg by mouth daily with breakfast.       . furosemide (LASIX) 40 MG tablet 1 every other day      . furosemide (LASIX) 40 MG tablet take 1 tablet by mouth once daily  30 tablet  3  . KLOR-CON M20 20 MEQ tablet take 1 tablet by mouth once daily  90 tablet  3  . omeprazole (PRILOSEC) 20 MG capsule take 1 capsule by  mouth once daily  30 capsule  5  . rosuvastatin (CRESTOR) 10 MG tablet Take 1 tablet (10 mg total) by mouth daily.  30 tablet  3   No current facility-administered medications for this visit.    BP 140/80  Pulse 78  Wt 58.968 kg (130 lb)  BMI 23.03 kg/m2 General: NAD Neck: No JVD, no thyromegaly or thyroid nodule.  Lungs: Clear bilaterally CV: Nondisplaced PMI.  Heart regular S1/S2, no S3/S4, 1/6 early SEM. No edema.  No carotid bruit.  Normal pedal pulses.  Abdomen: Soft, nontender, no hepatosplenomegaly, no distention.  Neurologic: Alert and oriented x 3.  Psych: Normal affect. Extremities: No clubbing or cyanosis.   Assessment/Plan: 1. Status post aortic  valve replacement for severe AS.  Last echo in 8/12 showed stable bioprosthetic valve and symptoms have not changed significantly.  2. CAD: Stable s/p LIMA-LAD at time of AVR.  No chest pain.  Continue ASA, Coreg, statin.  3. Cardiomyopathy: EF improved to 45-50% after AVR.  NYHA class II symptoms with no evidence for volume overload.  Continue current doses of Lasix and Coreg.  No ACEI given significant CKD. 4. CKD: Last creatinine was 2.6.  She had a recent BMET at her nephrologist's office, we will try to get a copy.  5. Hyperlipidemia: Good lipids when last checked.   Followup in 6 months.    [name] 10/25/2013

## 2013-11-11 DIAGNOSIS — N184 Chronic kidney disease, stage 4 (severe): Secondary | ICD-10-CM | POA: Diagnosis not present

## 2013-11-11 DIAGNOSIS — I10 Essential (primary) hypertension: Secondary | ICD-10-CM | POA: Diagnosis not present

## 2013-11-11 DIAGNOSIS — D649 Anemia, unspecified: Secondary | ICD-10-CM | POA: Diagnosis not present

## 2013-12-06 ENCOUNTER — Other Ambulatory Visit: Payer: Self-pay | Admitting: Cardiology

## 2013-12-08 ENCOUNTER — Other Ambulatory Visit: Payer: Self-pay | Admitting: Cardiology

## 2013-12-28 ENCOUNTER — Other Ambulatory Visit (INDEPENDENT_AMBULATORY_CARE_PROVIDER_SITE_OTHER): Payer: Self-pay | Admitting: Internal Medicine

## 2014-04-07 ENCOUNTER — Other Ambulatory Visit: Payer: Self-pay | Admitting: Cardiology

## 2014-07-22 DIAGNOSIS — I10 Essential (primary) hypertension: Secondary | ICD-10-CM | POA: Diagnosis not present

## 2014-07-22 DIAGNOSIS — D649 Anemia, unspecified: Secondary | ICD-10-CM | POA: Diagnosis not present

## 2014-07-22 DIAGNOSIS — N184 Chronic kidney disease, stage 4 (severe): Secondary | ICD-10-CM | POA: Diagnosis not present

## 2014-07-25 ENCOUNTER — Other Ambulatory Visit (INDEPENDENT_AMBULATORY_CARE_PROVIDER_SITE_OTHER): Payer: Self-pay | Admitting: Internal Medicine

## 2014-07-28 DIAGNOSIS — D649 Anemia, unspecified: Secondary | ICD-10-CM | POA: Diagnosis not present

## 2014-07-28 DIAGNOSIS — N184 Chronic kidney disease, stage 4 (severe): Secondary | ICD-10-CM | POA: Diagnosis not present

## 2014-07-28 DIAGNOSIS — I10 Essential (primary) hypertension: Secondary | ICD-10-CM | POA: Diagnosis not present

## 2014-08-02 ENCOUNTER — Other Ambulatory Visit: Payer: Self-pay | Admitting: *Deleted

## 2014-08-02 MED ORDER — ROSUVASTATIN CALCIUM 10 MG PO TABS: ORAL_TABLET | ORAL | Status: DC

## 2014-09-14 ENCOUNTER — Other Ambulatory Visit: Payer: Self-pay | Admitting: Cardiology

## 2014-09-16 ENCOUNTER — Encounter (INDEPENDENT_AMBULATORY_CARE_PROVIDER_SITE_OTHER): Payer: Self-pay | Admitting: Internal Medicine

## 2014-09-16 ENCOUNTER — Telehealth (INDEPENDENT_AMBULATORY_CARE_PROVIDER_SITE_OTHER): Payer: Self-pay | Admitting: *Deleted

## 2014-09-16 ENCOUNTER — Ambulatory Visit (INDEPENDENT_AMBULATORY_CARE_PROVIDER_SITE_OTHER): Payer: Medicare Other | Admitting: Internal Medicine

## 2014-09-16 VITALS — BP 128/68 | HR 72 | Temp 97.9°F | Ht 64.0 in | Wt 123.8 lb

## 2014-09-16 DIAGNOSIS — K219 Gastro-esophageal reflux disease without esophagitis: Secondary | ICD-10-CM

## 2014-09-16 DIAGNOSIS — R82998 Other abnormal findings in urine: Secondary | ICD-10-CM

## 2014-09-16 DIAGNOSIS — R8290 Unspecified abnormal findings in urine: Secondary | ICD-10-CM

## 2014-09-16 DIAGNOSIS — N390 Urinary tract infection, site not specified: Secondary | ICD-10-CM | POA: Insufficient documentation

## 2014-09-16 MED ORDER — CIPROFLOXACIN HCL 500 MG PO TABS: 500.0000 mg | ORAL_TABLET | Freq: Two times a day (BID) | ORAL | Status: DC

## 2014-09-16 MED ORDER — OMEPRAZOLE 20 MG PO CPDR: DELAYED_RELEASE_CAPSULE | ORAL | Status: DC

## 2014-09-16 NOTE — Progress Notes (Signed)
   Subjective:    Patient ID: [name], female    DOB: 1922/01/03, 80 y.o.   MRN: [id number]  HPI Here today for f/u of her GERD. She tells me she is doing good. She is using a walker now. Her balance is off.  Acid reflux is controlled with the omeprazole. Appetite is good. No weight loss. Her BMs are normal. No melena or BRRB. She does tell me her urine has a foul odor to it. No urinary frequency. Underwent a aortic valve replacement in 2012.    Review of Systems Past Medical History  Diagnosis Date  . Hypertension   . Anemia   . GERD (gastroesophageal reflux disease)   . CHF (congestive heart failure)   . CAD (coronary artery disease)   . Aortic stenosis     Past Surgical History  Procedure Laterality Date  . Hernia repair    . Abdominal hysterectomy    . Tonsillectomy    . Cataract extraction    . Cystostomy    . Cardiac surgery    . Coronary artery bypass graft  08/19/22  . Aortic valve replacement  08/10/11    Allergies  Allergen Reactions  . Fish Allergy Shortness Of Breath and Swelling  . Lipitor [Atorvastatin Calcium] Rash    Rash all over  . Cheese Other (See Comments)    Lips burn when she eats too much  . Morphine And Related Nausea And Vomiting  . Penicillins Other (See Comments)    unknown  . Sulfa Antibiotics Other (See Comments)    unknown    Current Outpatient Prescriptions on File Prior to Visit  Medication Sig Dispense Refill  . acetaminophen (TYLENOL) 325 MG tablet Take 650 mg by mouth every 6 (six) hours as needed. For pain       . aspirin EC 325 MG tablet Take 325 mg by mouth daily.        . Calcium Carbonate (CALTRATE 600 PO) Take 1 tablet by mouth daily.        . carvedilol (COREG) 6.25 MG tablet take 1 tablet twice a day  180 tablet  1  . CRESTOR 10 MG tablet take 1 tablet by mouth once daily  30 tablet  0  . furosemide (LASIX) 40 MG tablet take 1 tablet by mouth once daily  30 tablet  3   No current facility-administered  medications on file prior to visit.        Objective:   Physical Exam Filed Vitals:   09/16/14 1545  BP: 128/68  Pulse: 72  Temp: 97.9 F (36.6 C)  Height: 5\' 4"  (1.626 m)  Weight: 123 lb 12.8 oz (56.155 kg)   Alert and oriented. Skin warm and dry. Oral mucosa is moist.   . Sclera anicteric, conjunctivae is pink. Thyroid not enlarged. No cervical lymphadenopathy. Lungs clear. Heart regular rate and rhythm.  Abdomen is soft. Bowel sounds are positive. No hepatomegaly. No abdominal masses felt. No tenderness.  No edema to lower extremities. Patient is alert and oriented.       Assessment & Plan:  GERD, controlled at this time with Omperazole. Possible UTI: Urinalysis ordered.

## 2014-09-16 NOTE — Telephone Encounter (Signed)
A prior Josem Kaufmann was done. Patient is approved from 08-17-14/09-16-15. Reference Number is [id number]. [facility]/[place] made aware.

## 2014-09-16 NOTE — Patient Instructions (Addendum)
OV in 1 year. Refill on Omperazole

## 2014-09-17 ENCOUNTER — Telehealth (INDEPENDENT_AMBULATORY_CARE_PROVIDER_SITE_OTHER): Payer: Self-pay | Admitting: *Deleted

## 2014-09-17 LAB — URINALYSIS
Bilirubin Urine: NEGATIVE
Glucose, UA: NEGATIVE mg/dL
Ketones, ur: NEGATIVE mg/dL
NITRITE: POSITIVE — AB
PH: 5.5 (ref 5.0–8.0)
Protein, ur: NEGATIVE mg/dL
SPECIFIC GRAVITY, URINE: 1.017 (ref 1.005–1.030)
Urobilinogen, UA: 0.2 mg/dL (ref 0.0–1.0)

## 2014-09-17 NOTE — Telephone Encounter (Signed)
We recd' the results from a U/A that [name] ordered on 09/16/14. The results indicated that the patient has a UTI. This was reviewed with Dr.Rehman. Per Dr.Rehman call in the following: Cipro 250 mg Take 1 by mouth twice a day for 5 days,#10 no refills. This was called to [facility]/[name]. Patient was called and made aware of the result and Dr.Rehman's recommendation.  He also advised that the patient follow-up with her PCP for a repeat U/A in 1 week , or she could follow up with [name]. Patient states that she will see her PCP.

## 2014-09-17 NOTE — Telephone Encounter (Signed)
This was forwarded to [place] for review.

## 2014-09-20 NOTE — Telephone Encounter (Signed)
She was covered by me orginally with Cipro 500mg  BID x 3 days

## 2014-09-21 ENCOUNTER — Telehealth (INDEPENDENT_AMBULATORY_CARE_PROVIDER_SITE_OTHER): Payer: Self-pay | Admitting: *Deleted

## 2014-09-21 NOTE — Telephone Encounter (Signed)
I advised her to stop the antibiotics

## 2014-09-21 NOTE — Telephone Encounter (Signed)
[name] was given a 3 day supply of Cipro then it was changed to 5 days with a higher dose. She has completed the 5 day course and still has some left. Should she complete this or stop and come back to recheck for infection? Please call Gracelynne at  [phone].

## 2014-10-15 ENCOUNTER — Other Ambulatory Visit: Payer: Self-pay

## 2014-10-15 MED ORDER — ROSUVASTATIN CALCIUM 10 MG PO TABS: ORAL_TABLET | ORAL | Status: DC

## 2014-11-01 ENCOUNTER — Encounter: Payer: Self-pay | Admitting: Physician Assistant

## 2014-11-01 ENCOUNTER — Ambulatory Visit (INDEPENDENT_AMBULATORY_CARE_PROVIDER_SITE_OTHER): Payer: Medicare Other | Admitting: Physician Assistant

## 2014-11-01 VITALS — BP 130/90 | HR 104 | Ht 64.0 in | Wt 128.0 lb

## 2014-11-01 DIAGNOSIS — I471 Supraventricular tachycardia: Secondary | ICD-10-CM

## 2014-11-01 DIAGNOSIS — I5022 Chronic systolic (congestive) heart failure: Secondary | ICD-10-CM

## 2014-11-01 DIAGNOSIS — I10 Essential (primary) hypertension: Secondary | ICD-10-CM

## 2014-11-01 DIAGNOSIS — I25810 Atherosclerosis of coronary artery bypass graft(s) without angina pectoris: Secondary | ICD-10-CM | POA: Diagnosis not present

## 2014-11-01 DIAGNOSIS — E785 Hyperlipidemia, unspecified: Secondary | ICD-10-CM | POA: Diagnosis not present

## 2014-11-01 DIAGNOSIS — R000 Tachycardia, unspecified: Secondary | ICD-10-CM | POA: Insufficient documentation

## 2014-11-01 MED ORDER — CARVEDILOL 12.5 MG PO TABS: 12.5000 mg | ORAL_TABLET | Freq: Two times a day (BID) | ORAL | Status: DC

## 2014-11-01 MED ORDER — ROSUVASTATIN CALCIUM 10 MG PO TABS: ORAL_TABLET | ORAL | Status: DC

## 2014-11-01 NOTE — Patient Instructions (Addendum)
Your physician recommends that you schedule a follow-up appointment on November 17, 2014 in [place] office with Ermalinda Barrios, PA and in 4 months with Dr. Aundra Dubin in [facility] office.   Your physician has recommended you make the following change in your medication:  Increase Coreg to 12.5 mg by mouth twice daily

## 2014-11-01 NOTE — Assessment & Plan Note (Signed)
Patient's heart rate was elevated when she came in today but it stayed elevated after sitting for a while. She did take her carvedilol this morning. Increase carvedilol to 12.5 mg twice a day. I will recheck her in 2 weeks.

## 2014-11-01 NOTE — Assessment & Plan Note (Signed)
NYHA class II symptoms with no evidence of heart failure. Continue current dose of Lasix.

## 2014-11-01 NOTE — Assessment & Plan Note (Signed)
I will renew her Crestor. She is due for fasting lipid panel and LFTs. She is scheduled for blood work by her kidney specialist. It's like to have it drawn at that time. I've asked her to send a copy.

## 2014-11-01 NOTE — Assessment & Plan Note (Signed)
Blood pressure is elevated today. Increase Coreg to 12.5 mg twice a day. I will see her back in [place] office in 2 weeks to followup hypertension.

## 2014-11-01 NOTE — Progress Notes (Signed)
[id number] is an 80 year old female patient of Dr. Aundra Dubin who has history of CABG and aVR with a LIMA to the LAD and bioprosthetic aVR 08/12/11 after presenting with acute systolic heart failure and pulmonary edema EF 20% with global hypokinesis and severe aortic stenosis. She had postop atrial fibrillation converted to normal sinus rhythm with amiodarone. She was readmitted with a large left pleural effusion and underwent thoracentesis. Repeat echo after surgery EF 45-50%. She last saw Dr. Aundra Dubin 10/23/13 at which time she was having trouble with balance and was using a cane or walker. Lasix was decreased to every other day by nephrology because of a higher creatinine.  Patient comes in today for yearly followup. Overall she is still doing well. She drives and lives alone. She uses a cane to walk. She has some degree of dyspnea on exertion but is not limited by this. She denies any chest pain, palpitations, dizziness, or presyncope.  Allergies  Allergen Reactions  . Fish Allergy Shortness Of Breath and Swelling  . Lipitor [Atorvastatin Calcium] Rash    Rash all over  . Cheese Other (See Comments)    Lips burn when she eats too much  . Morphine And Related Nausea And Vomiting  . Penicillins Other (See Comments)    unknown  . Sulfa Antibiotics Other (See Comments)    unknown     Current Outpatient Prescriptions  Medication Sig Dispense Refill  . acetaminophen (TYLENOL) 325 MG tablet Take 650 mg by mouth every 6 (six) hours as needed. For pain     . aspirin EC 325 MG tablet Take 325 mg by mouth daily.      . Calcium Carbonate (CALTRATE 600 PO) Take 1 tablet by mouth daily.      . carvedilol (COREG) 6.25 MG tablet take 1 tablet twice a day 180 tablet 1  . ciprofloxacin (CIPRO) 500 MG tablet Take 1 tablet (500 mg total) by mouth 2 (two) times daily. 6 tablet 0  . furosemide (LASIX) 40 MG tablet take 1 tablet by mouth once daily 30 tablet 3  . omeprazole (PRILOSEC) 20 MG capsule TAKE  ONE CAPSULE BY MOUTH ONCE DAILY 30 capsule 11  . potassium chloride SA (K-DUR,KLOR-CON) 20 MEQ tablet every other day    . rosuvastatin (CRESTOR) 10 MG tablet take 1 tablet by mouth once daily 30 tablet 0   No current facility-administered medications for this visit.    Past Medical History  Diagnosis Date  . Hypertension   . Anemia   . GERD (gastroesophageal reflux disease)   . CHF (congestive heart failure)   . CAD (coronary artery disease)   . Aortic stenosis     Past Surgical History  Procedure Laterality Date  . Hernia repair    . Abdominal hysterectomy    . Tonsillectomy    . Cataract extraction    . Cystostomy    . Cardiac surgery    . Coronary artery bypass graft  08/19/22  . Aortic valve replacement  08/10/11    Family History  Problem Relation Age of Onset  . Heart failure Father   . Heart attack Father     History   Social History  . Marital Status: Widowed    Spouse Name: N/A    Number of Children: N/A  . Years of Education: N/A   Occupational History  . Not on file.   Social History Main Topics  . Smoking status: Never Smoker   . Smokeless tobacco: Never  Used  . Alcohol Use: No  . Drug Use: No  . Sexual Activity: No   Other Topics Concern  . Not on file   Social History Narrative    [id number] of hearing, slurred speech which is chronic for her, otherwise see history of present illness  BP 130/90 mmHg  Pulse 104  Ht 5\' 4"  (1.626 m)  Wt 128 lb (58.06 kg)  BMI 21.96 kg/m2  PHYSICAL EXAM: Well-nournished, in no acute distress. Neck: No JVD, HJR, Bruit, or thyroid enlargement  Lungs: No tachypnea, clear without wheezing, rales, or rhonchi  Cardiovascular: RRR, PMI not displaced, Normal S1 and S2, [id number] systolic murmur at the left sternal border and apex, no gallops, bruit, thrill, or heave.  Abdomen: BS normal. Soft without organomegaly, masses, lesions or tenderness.  Extremities: without cyanosis, clubbing or edema. Good distal pulses  bilateral  SKin: Warm, no lesions or rashes   Musculoskeletal: No deformities  Neuro: no focal signs   Wt Readings from Last 3 Encounters:  09/16/14 123 lb 12.8 oz (56.155 kg)  10/23/13 130 lb (58.968 kg)  04/03/13 124 lb (56.246 kg)    Lab Results  Component Value Date   WBC 6.8 08/29/2011   HGB 9.1* 08/29/2011   HCT 28.5* 08/29/2011   PLT 286 08/29/2011   GLUCOSE 88 05/05/2013   CHOL 123 04/03/2013   TRIG 111.0 04/03/2013   HDL 47.10 04/03/2013   LDLCALC 54 04/03/2013   ALT 18 11/01/2011   AST 16 11/01/2011   NA 139 05/05/2013   K 4.0 05/05/2013   CL 104 05/05/2013   CREATININE 2.6* 05/05/2013   BUN 42* 05/05/2013   CO2 27 05/05/2013   TSH 3.703 08/28/2011   INR 1.55* 08/10/2011    [id number] tachycardia at 104 beats per minute with intraventricular conduction delay nonspecific ST-T wave changes, no acute change

## 2014-11-01 NOTE — Assessment & Plan Note (Signed)
Stable without chest pain 

## 2014-11-05 ENCOUNTER — Other Ambulatory Visit: Payer: Self-pay

## 2014-11-05 MED ORDER — CARVEDILOL 12.5 MG PO TABS: 12.5000 mg | ORAL_TABLET | Freq: Two times a day (BID) | ORAL | Status: DC

## 2014-11-08 ENCOUNTER — Telehealth: Payer: Self-pay | Admitting: Cardiology

## 2014-11-08 NOTE — Telephone Encounter (Signed)
Pt's daughter requesting order for lipid panel and LFTs faxed to [facility] [phone] where pt is going for other lab ordered by her kidney doctor.

## 2014-11-08 NOTE — Telephone Encounter (Signed)
New problem   Pt need to speak to nurse concerning labs that pt need to have that she need an order for. Please call pt's daughter.

## 2014-11-09 NOTE — Telephone Encounter (Signed)
Order to HIM to fax to [facility]

## 2014-11-12 ENCOUNTER — Other Ambulatory Visit: Payer: Self-pay

## 2014-11-12 ENCOUNTER — Other Ambulatory Visit: Payer: Self-pay | Admitting: Cardiology

## 2014-11-12 DIAGNOSIS — E559 Vitamin D deficiency, unspecified: Secondary | ICD-10-CM | POA: Diagnosis not present

## 2014-11-12 DIAGNOSIS — I10 Essential (primary) hypertension: Secondary | ICD-10-CM | POA: Diagnosis not present

## 2014-11-12 DIAGNOSIS — Z79899 Other long term (current) drug therapy: Secondary | ICD-10-CM

## 2014-11-12 DIAGNOSIS — R809 Proteinuria, unspecified: Secondary | ICD-10-CM | POA: Diagnosis not present

## 2014-11-12 DIAGNOSIS — E785 Hyperlipidemia, unspecified: Secondary | ICD-10-CM | POA: Diagnosis not present

## 2014-11-12 DIAGNOSIS — N183 Chronic kidney disease, stage 3 (moderate): Secondary | ICD-10-CM | POA: Diagnosis not present

## 2014-11-12 DIAGNOSIS — D649 Anemia, unspecified: Secondary | ICD-10-CM | POA: Diagnosis not present

## 2014-11-13 LAB — LIPID PANEL
CHOL/HDL RATIO: 2.2 ratio
CHOLESTEROL: 134 mg/dL (ref 0–200)
HDL: 60 mg/dL (ref >39)
LDL Cholesterol: 52 mg/dL (ref 0–99)
TRIGLYCERIDES: 111 mg/dL (ref <150)
VLDL: 22 mg/dL (ref 0–40)

## 2014-11-13 LAB — HEPATIC FUNCTION PANEL
ALBUMIN: 3.8 g/dL (ref 3.5–5.2)
ALT: 9 U/L (ref 0–35)
AST: 13 U/L (ref 0–37)
Alkaline Phosphatase: 58 U/L (ref 39–117)
Bilirubin, Direct: 0.1 mg/dL (ref 0.0–0.3)
Indirect Bilirubin: 0.3 mg/dL (ref 0.2–1.2)
TOTAL PROTEIN: 6.4 g/dL (ref 6.0–8.3)
Total Bilirubin: 0.4 mg/dL (ref 0.2–1.2)

## 2014-11-16 DIAGNOSIS — R809 Proteinuria, unspecified: Secondary | ICD-10-CM | POA: Diagnosis not present

## 2014-11-16 DIAGNOSIS — N184 Chronic kidney disease, stage 4 (severe): Secondary | ICD-10-CM | POA: Diagnosis not present

## 2014-11-16 DIAGNOSIS — N2581 Secondary hyperparathyroidism of renal origin: Secondary | ICD-10-CM | POA: Diagnosis not present

## 2014-11-16 DIAGNOSIS — I10 Essential (primary) hypertension: Secondary | ICD-10-CM | POA: Diagnosis not present

## 2014-11-17 ENCOUNTER — Ambulatory Visit: Payer: Medicare Other | Admitting: Physician Assistant

## 2014-12-01 ENCOUNTER — Ambulatory Visit (INDEPENDENT_AMBULATORY_CARE_PROVIDER_SITE_OTHER): Payer: Medicare Other | Admitting: Physician Assistant

## 2014-12-01 ENCOUNTER — Encounter: Payer: Self-pay | Admitting: Physician Assistant

## 2014-12-01 VITALS — BP 142/88 | HR 81 | Ht 64.0 in | Wt 125.0 lb

## 2014-12-01 DIAGNOSIS — I471 Supraventricular tachycardia: Secondary | ICD-10-CM | POA: Diagnosis not present

## 2014-12-01 DIAGNOSIS — I5022 Chronic systolic (congestive) heart failure: Secondary | ICD-10-CM

## 2014-12-01 DIAGNOSIS — I10 Essential (primary) hypertension: Secondary | ICD-10-CM

## 2014-12-01 DIAGNOSIS — I25811 Atherosclerosis of native coronary artery of transplanted heart without angina pectoris: Secondary | ICD-10-CM

## 2014-12-01 DIAGNOSIS — R000 Tachycardia, unspecified: Secondary | ICD-10-CM

## 2014-12-01 NOTE — Assessment & Plan Note (Signed)
Blood pressure is up a little in the office today but has been much better at home on the increase carvedilol. Continue current dose. Follow-up with Dr. Marigene Ehlers in March.

## 2014-12-01 NOTE — Patient Instructions (Signed)
Your physician recommends that you continue on your current medications as directed. Please refer to the Current Medication list given to you today.  PLEASE GET YOUR FLU SHOT AFTER YOU ARE FEELING BETTER IN 1 WEEK  Thank you for choosing [facility]!!

## 2014-12-01 NOTE — Assessment & Plan Note (Signed)
No evidence of heart failure on exam today.

## 2014-12-01 NOTE — Assessment & Plan Note (Signed)
Tachycardia better since increasing Coreg. Continue current dose.

## 2014-12-01 NOTE — Progress Notes (Signed)
[name]:[id number] is an 80 year old female patient of Dr. Aundra Dubin who has history of CABG and aVR with a LIMA to the LAD and bioprosthetic aVR 08/12/11 after presenting with acute systolic heart failure and pulmonary edema EF 20% with global hypokinesis and severe aortic stenosis. She had postop atrial fibrillation converted to normal sinus rhythm with amiodarone. She was readmitted with a large left pleural effusion and underwent thoracentesis. Repeat echo after surgery EF 45-50%. She last saw Dr. Aundra Dubin 10/23/13 at which time she was having trouble with balance and was using a cane or walker. Lasix was decreased to every other day by nephrology because of a higher creatinine.  I saw the patient in the [place] office on 11/01/14 at which time her blood pressure was elevated and she was tachycardic. I increased her Coreg to 12.5 mg twice a day and asked her to come back in follow-up. Lipid panel drawn on 11/12/14 was good with cholesterol 134 triglycerides 111 HDL 60 LDL 52.  Patient's daughter is kept track of her blood pressures and heart rates at home. Her blood pressures typically run 120/80 and pulse in the 80s. The patient is doing better since increasing the Coreg. She has had a lot of trouble with her sinuses recently and felt bad all day yesterday. She is feeling much better today. She is in transition with primary care doctors since hers retired.    Allergies  Allergen Reactions  . Fish Allergy Shortness Of Breath and Swelling  . Lipitor [Atorvastatin Calcium] Rash    Rash all over  . Cheese Other (See Comments)    Lips burn when she eats too much  . Morphine And Related Nausea And Vomiting  . Penicillins Other (See Comments)    unknown  . Sulfa Antibiotics Other (See Comments)    unknown     Current Outpatient Prescriptions  Medication Sig Dispense Refill  . acetaminophen (TYLENOL) 325 MG tablet Take 650 mg by mouth every 6 (six) hours as needed. For pain     . aspirin EC  325 MG tablet Take 325 mg by mouth daily.      . Calcium Carbonate (CALTRATE 600 PO) Take 1 tablet by mouth daily.      . carvedilol (COREG) 12.5 MG tablet Take 1 tablet (12.5 mg total) by mouth 2 (two) times daily. 180 tablet 3  . furosemide (LASIX) 40 MG tablet take 1 tablet by mouth once daily 30 tablet 3  . omeprazole (PRILOSEC) 20 MG capsule TAKE ONE CAPSULE BY MOUTH ONCE DAILY 30 capsule 11  . potassium chloride SA (K-DUR,KLOR-CON) 20 MEQ tablet every other day    . rosuvastatin (CRESTOR) 10 MG tablet take 1 tablet by mouth once daily 30 tablet 6   No current facility-administered medications for this visit.    Past Medical History  Diagnosis Date  . Hypertension   . Anemia   . GERD (gastroesophageal reflux disease)   . CHF (congestive heart failure)   . CAD (coronary artery disease)   . Aortic stenosis     Past Surgical History  Procedure Laterality Date  . Hernia repair    . Abdominal hysterectomy    . Tonsillectomy    . Cataract extraction    . Cystostomy    . Cardiac surgery    . Coronary artery bypass graft  08/19/22  . Aortic valve replacement  08/10/11    Family History  Problem Relation Age of Onset  . Heart failure Father   .  Heart attack Father     History   Social History  . Marital Status: Widowed    Spouse Name: N/A    Number of Children: N/A  . Years of Education: N/A   Occupational History  . Not on file.   Social History Main Topics  . Smoking status: Never Smoker   . Smokeless tobacco: Never Used  . Alcohol Use: No  . Drug Use: No  . Sexual Activity: No   Other Topics Concern  . Not on file   Social History Narrative    ROS: See history of present illness otherwise negative  BP 142/88 mmHg  Pulse 81  Ht 5\' 4"  (1.626 m)  Wt 125 lb (56.7 kg)  BMI 21.45 kg/m2  PHYSICAL EXAM: Well-nournished, in no acute distress. Neck: No JVD, HJR, Bruit, or thyroid enlargement  Lungs: No tachypnea, clear without wheezing, rales, or  rhonchi  Cardiovascular: RRR, PMI not displaced, Normal S1 and S2, 2/6 systolic murmur at the left sternal border, no gallops, bruit, thrill, or heave.  Abdomen: BS normal. Soft without organomegaly, masses, lesions or tenderness.  Extremities: without cyanosis, clubbing or edema. Good distal pulses bilateral  SKin: Warm, no lesions or rashes   Musculoskeletal: No deformities  Neuro: no focal signs   Wt Readings from Last 3 Encounters:  11/01/14 128 lb (58.06 kg)  09/16/14 123 lb 12.8 oz (56.155 kg)  10/23/13 130 lb (58.968 kg)    Lab Results  Component Value Date   WBC 6.8 08/29/2011   HGB 9.1* 08/29/2011   HCT 28.5* 08/29/2011   PLT 286 08/29/2011   GLUCOSE 88 05/05/2013   CHOL 134 11/12/2014   TRIG 111 11/12/2014   HDL 60 11/12/2014   LDLCALC 52 11/12/2014   ALT 9 11/12/2014   AST 13 11/12/2014   NA 139 05/05/2013   K 4.0 05/05/2013   CL 104 05/05/2013   CREATININE 2.6* 05/05/2013   BUN 42* 05/05/2013   CO2 27 05/05/2013   TSH 3.703 08/28/2011   INR 1.55* 08/10/2011    EKG: Not performed

## 2014-12-01 NOTE — Assessment & Plan Note (Signed)
Stable without symptoms.

## 2015-01-24 ENCOUNTER — Other Ambulatory Visit: Payer: Self-pay | Admitting: *Deleted

## 2015-01-24 MED ORDER — POTASSIUM CHLORIDE CRYS ER 20 MEQ PO TBCR: EXTENDED_RELEASE_TABLET | ORAL | Status: DC

## 2015-02-23 ENCOUNTER — Other Ambulatory Visit: Payer: Self-pay

## 2015-02-23 MED ORDER — FUROSEMIDE 40 MG PO TABS: 40.0000 mg | ORAL_TABLET | Freq: Every day | ORAL | Status: DC

## 2015-03-03 ENCOUNTER — Encounter: Payer: Self-pay | Admitting: *Deleted

## 2015-03-03 ENCOUNTER — Encounter: Payer: Self-pay | Admitting: Cardiology

## 2015-03-03 ENCOUNTER — Ambulatory Visit (INDEPENDENT_AMBULATORY_CARE_PROVIDER_SITE_OTHER): Payer: Medicare Other | Admitting: Cardiology

## 2015-03-03 VITALS — BP 120/64 | HR 67 | Ht 64.0 in | Wt 127.0 lb

## 2015-03-03 DIAGNOSIS — I359 Nonrheumatic aortic valve disorder, unspecified: Secondary | ICD-10-CM

## 2015-03-03 DIAGNOSIS — I350 Nonrheumatic aortic (valve) stenosis: Secondary | ICD-10-CM | POA: Diagnosis not present

## 2015-03-03 DIAGNOSIS — I2510 Atherosclerotic heart disease of native coronary artery without angina pectoris: Secondary | ICD-10-CM

## 2015-03-03 NOTE — Patient Instructions (Signed)
Your physician has requested that you have an echocardiogram. Echocardiography is a painless test that uses sound waves to create images of your heart. It provides your doctor with information about the size and shape of your heart and how well your heart's chambers and valves are working. This procedure takes approximately one hour. There are no restrictions for this procedure.  Your physician wants you to follow-up in: 1 year with Dr Aundra Dubin. (March 2017).  You will receive a reminder letter in the mail two months in advance. If you don't receive a letter, please call our office to schedule the follow-up appointment.

## 2015-03-04 NOTE — Progress Notes (Signed)
Patient ID: [name], female   DOB: 12/12/1922, 81 y.o.   MRN: [id number] PCP: Dr. Orson Ape  81 yo presents for followup after CABG-AVR.  Patient presented to [facility] with acute systolic CHF/pulmonary edema in 7/12.  Echo showed EF 20%, global hypokinesis.  There was severe aortic stenosis.  Left heart cath showed a calcified 80% mid LAD stenosis.  The cardiomyopathy was thought to be secondary to aortic stenosis.  Patient had CABG-AVR with LIMA-LAD and bioprosthetic AVR in 8/12.  She had post-operative atrial fibrillation that terminated with amiodarone and has not returned.  She was readmitted later in 8/12 with a large left pleural effusion and underwent thoracentesis.   Repeat echo after surgery showed EF 45-50%.   She continues to have trouble with balance and uses a cane or walker.  No recent falls.  No lightheadedness. 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  She still lives alone and drives.  No chest pain.  She sees Dr Lowanda Foster for CKD. She is taking Lasix every other day.   Labs (9/12): K 4.3, creatinine 1.2 Labs (10/12): K 4.6, creatinine 1.82 Labs (11/12): LDL 49, HDL 60 Labs (1/13): K 4.6, creatinine 1.6, BNP 435 Labs (5/13): K 3.8, creatinine 1.5, LDL 38, HDL 63 Labs (9/13): K 3.8, creatinine 1.9 Labs (4/14): LDL 54, HDL 47 Labs (5/14): K 4, creatinine 2.6 Labs (11/15): LDL 52, HDL 60  ECG: NSR, iRBBB, inferior Qs, poor anterior R wave progression.   Allergies: Lipitor (rash)  PMH: 1. CKD 2. Cardiomyopathy due to aortic stenosis.  Echo (7/12) with EF 20%, global hypokinesis, severe aortic stenosis.  Repeat Echo in 8/12 after AVR showed EF 45-50%, normal bioprosthetic aortic valve function.   3. Severe aortic stenosis: Bioprosthetic AVR in 8/12 (Edwards pericardial Magna-Ease #23).   4. Atrial fibrillation: post-operative after AVR in 8/12, converted to NSR with amiodarone.  5. CAD: LHC (8/12) with calcified plaque in LAD up to 80%  stenosis in mid LAD, 50-60% serial distal LAD stenosis. LIMA-LAD at time of AVR.  6. Hyperlipidemia 7. HTN 8. Erosive esophagitis 9. Breast cancer s/p bilateral partial mastectomies 10. Carotid dopplers (6/13): minimal disease.   SH: Lives in [place] alone, widowed, has 3 children (1 in [place]).  Nonsmoker, no ETOH.   FH: Mother with CVA at 53, father with MI at 72.   ROS: All systems reviewed and negative except as per HPI.  Current Outpatient Prescriptions  Medication Sig Dispense Refill  . aspirin EC 325 MG tablet Take 325 mg by mouth daily.      . Calcium Carbonate (CALTRATE 600 PO) Take 1 tablet by mouth daily.      . carvedilol (COREG) 12.5 MG tablet Take 1 tablet (12.5 mg total) by mouth 2 (two) times daily. 180 tablet 3  . furosemide (LASIX) 40 MG tablet Take 1 tablet (40 mg total) by mouth daily. 30 tablet 3  . loratadine (CLARITIN) 10 MG tablet Take 10 mg by mouth daily.    Marland Kitchen omeprazole (PRILOSEC) 20 MG capsule TAKE ONE CAPSULE BY MOUTH ONCE DAILY 30 capsule 11  . potassium chloride SA (K-DUR,KLOR-CON) 20 MEQ tablet Take one tablet by mouth every other day 45 tablet 0  . rosuvastatin (CRESTOR) 10 MG tablet take 1 tablet by mouth once daily 30 tablet 6  . acetaminophen (TYLENOL) 325 MG tablet Take 650 mg by mouth every 6 (six) hours as needed. For pain      No current  facility-administered medications for this visit.    BP 120/64 mmHg  Pulse 67  Ht 5\' 4"  (1.626 m)  Wt 127 lb (57.607 kg)  BMI 21.79 kg/m2  SpO2 98% General: NAD Neck: No JVD, no thyromegaly or thyroid nodule.  Lungs: Clear bilaterally CV: Nondisplaced PMI.  Heart regular S1/S2, no S3/S4, 1/6 early SEM. No edema.  No carotid bruit.  Normal pedal pulses.  Abdomen: Soft, nontender, no hepatosplenomegaly, no distention.  Neurologic: Alert and oriented x 3.  Psych: Normal affect. Extremities: No clubbing or cyanosis.   Assessment/Plan: 1. Status post aortic valve replacement for severe AS. I will  get an echo to follow her LV function and valve. 2. CAD: Stable s/p LIMA-LAD at time of AVR.  No chest pain.  Continue ASA, Coreg, statin.  3. Cardiomyopathy: EF improved to 45-50% after AVR.  NYHA class II symptoms with no evidence for volume overload.  Continue current doses of Lasix and Coreg.  No ACEI given significant CKD. 4. CKD: I will get a copy of last BMET from her nephrologist.  5. Hyperlipidemia: Good lipids when last checked in 11/15.   [name] 03/04/2015

## 2015-03-09 ENCOUNTER — Ambulatory Visit (HOSPITAL_COMMUNITY): Payer: Medicare Other | Attending: Cardiovascular Disease | Admitting: Cardiology

## 2015-03-09 ENCOUNTER — Ambulatory Visit: Payer: Medicare Other | Admitting: Cardiology

## 2015-03-09 DIAGNOSIS — I2510 Atherosclerotic heart disease of native coronary artery without angina pectoris: Secondary | ICD-10-CM

## 2015-03-09 DIAGNOSIS — I359 Nonrheumatic aortic valve disorder, unspecified: Secondary | ICD-10-CM

## 2015-03-09 NOTE — Progress Notes (Signed)
Echo performed. 

## 2015-04-24 ENCOUNTER — Other Ambulatory Visit: Payer: Self-pay | Admitting: Cardiology

## 2015-04-25 ENCOUNTER — Other Ambulatory Visit: Payer: Self-pay

## 2015-04-25 MED ORDER — FUROSEMIDE 40 MG PO TABS: ORAL_TABLET | ORAL | Status: DC

## 2015-04-25 MED ORDER — POTASSIUM CHLORIDE CRYS ER 20 MEQ PO TBCR: EXTENDED_RELEASE_TABLET | ORAL | Status: DC

## 2015-04-25 NOTE — Telephone Encounter (Signed)
Lasix  Was changed to every other day 10/28/13, 11/01/14 and 03/03/15

## 2015-06-16 ENCOUNTER — Encounter (INDEPENDENT_AMBULATORY_CARE_PROVIDER_SITE_OTHER): Payer: Self-pay | Admitting: *Deleted

## 2015-06-17 ENCOUNTER — Other Ambulatory Visit: Payer: Self-pay

## 2015-06-17 MED ORDER — ROSUVASTATIN CALCIUM 10 MG PO TABS: ORAL_TABLET | ORAL | Status: DC

## 2015-07-07 DIAGNOSIS — Z961 Presence of intraocular lens: Secondary | ICD-10-CM | POA: Diagnosis not present

## 2015-08-04 DIAGNOSIS — R809 Proteinuria, unspecified: Secondary | ICD-10-CM | POA: Diagnosis not present

## 2015-08-04 DIAGNOSIS — N183 Chronic kidney disease, stage 3 (moderate): Secondary | ICD-10-CM | POA: Diagnosis not present

## 2015-08-04 DIAGNOSIS — E559 Vitamin D deficiency, unspecified: Secondary | ICD-10-CM | POA: Diagnosis not present

## 2015-08-04 DIAGNOSIS — I10 Essential (primary) hypertension: Secondary | ICD-10-CM | POA: Diagnosis not present

## 2015-08-04 DIAGNOSIS — D509 Iron deficiency anemia, unspecified: Secondary | ICD-10-CM | POA: Diagnosis not present

## 2015-08-04 DIAGNOSIS — Z79899 Other long term (current) drug therapy: Secondary | ICD-10-CM | POA: Diagnosis not present

## 2015-08-04 DIAGNOSIS — D649 Anemia, unspecified: Secondary | ICD-10-CM | POA: Diagnosis not present

## 2015-08-09 DIAGNOSIS — N184 Chronic kidney disease, stage 4 (severe): Secondary | ICD-10-CM | POA: Diagnosis not present

## 2015-08-09 DIAGNOSIS — R809 Proteinuria, unspecified: Secondary | ICD-10-CM | POA: Diagnosis not present

## 2015-08-09 DIAGNOSIS — I509 Heart failure, unspecified: Secondary | ICD-10-CM | POA: Diagnosis not present

## 2015-08-09 DIAGNOSIS — I10 Essential (primary) hypertension: Secondary | ICD-10-CM | POA: Diagnosis not present

## 2015-09-20 ENCOUNTER — Ambulatory Visit (INDEPENDENT_AMBULATORY_CARE_PROVIDER_SITE_OTHER): Payer: Medicare Other | Admitting: Internal Medicine

## 2015-10-03 ENCOUNTER — Other Ambulatory Visit (INDEPENDENT_AMBULATORY_CARE_PROVIDER_SITE_OTHER): Payer: Self-pay | Admitting: Internal Medicine

## 2015-12-27 ENCOUNTER — Other Ambulatory Visit: Payer: Self-pay | Admitting: *Deleted

## 2015-12-27 MED ORDER — CARVEDILOL 12.5 MG PO TABS: 12.5000 mg | ORAL_TABLET | Freq: Two times a day (BID) | ORAL | Status: DC

## 2016-01-03 ENCOUNTER — Other Ambulatory Visit: Payer: Self-pay | Admitting: Cardiology

## 2016-03-27 ENCOUNTER — Other Ambulatory Visit: Payer: Self-pay | Admitting: Cardiology

## 2016-04-18 ENCOUNTER — Ambulatory Visit: Payer: Medicare Other | Admitting: Physician Assistant

## 2016-05-01 ENCOUNTER — Other Ambulatory Visit: Payer: Self-pay | Admitting: Cardiology

## 2016-05-02 ENCOUNTER — Ambulatory Visit: Payer: Medicare Other | Admitting: Physician Assistant

## 2016-05-03 NOTE — Telephone Encounter (Signed)
Spoke with patient for clarification on how she takes this medication and she stated that she takes 40mg  every other night along with the potassium.

## 2016-05-07 ENCOUNTER — Other Ambulatory Visit: Payer: Self-pay | Admitting: Cardiology

## 2016-05-23 ENCOUNTER — Ambulatory Visit (INDEPENDENT_AMBULATORY_CARE_PROVIDER_SITE_OTHER): Payer: Medicare Other | Admitting: Physician Assistant

## 2016-05-23 ENCOUNTER — Encounter: Payer: Self-pay | Admitting: Physician Assistant

## 2016-05-23 VITALS — BP 118/78 | HR 81 | Ht 64.0 in | Wt 125.0 lb

## 2016-05-23 DIAGNOSIS — E785 Hyperlipidemia, unspecified: Secondary | ICD-10-CM

## 2016-05-23 DIAGNOSIS — I10 Essential (primary) hypertension: Secondary | ICD-10-CM

## 2016-05-23 DIAGNOSIS — I350 Nonrheumatic aortic (valve) stenosis: Secondary | ICD-10-CM

## 2016-05-23 DIAGNOSIS — I4891 Unspecified atrial fibrillation: Secondary | ICD-10-CM | POA: Diagnosis not present

## 2016-05-23 DIAGNOSIS — I25810 Atherosclerosis of coronary artery bypass graft(s) without angina pectoris: Secondary | ICD-10-CM

## 2016-05-23 MED ORDER — ROSUVASTATIN CALCIUM 10 MG PO TABS: 10.0000 mg | ORAL_TABLET | Freq: Every day | ORAL | Status: DC

## 2016-05-23 NOTE — Progress Notes (Signed)
Cardiology Office Note    Date:  05/23/2016   ID:  [name], [name] 08/09/1922, MRN [id number]  PCP:  Leonides Grills, MD  Cardiologist: Dr. Aundra Dubin   Chief complaint: yearly f/u  History of Present Illness:  [name] is a 82 y.o. female here with her daughter for yearly followup.patient has history of CABG/aVR with LIMA to the LAD and bioprosthetic aVR in 08/2011. EF initially was 20% with global hypokinesis but repeat echo EF improved to 45-50%.cardiomyopathy was felt secondary to aortic stenosis. She had postop atrial fibrillation converted to normal sinus rhythm with amiodarone, hypertension, hyperlipidemia and erosive esophagitis. She sees Dr. Delrae Sawyers for CKD.  Patient comes in today accompanied by her daughter. She continues to live alone here in [place] and drives. She walks with a walker and her main complaint is balance issues. She still does her on housework. She denies any chest pain, palpitations, dyspnea, dyspnea on exertion, dizziness or presyncope. She has not had blood work in over a year. She is to see her new primary care Delman Chead[facility] tomorrow.    Past Medical History  Diagnosis Date  . Hypertension   . Anemia   . GERD (gastroesophageal reflux disease)   . CHF (congestive heart failure) ([place])   . CAD (coronary artery disease)   . Aortic stenosis     Past Surgical History  Procedure Laterality Date  . Hernia repair    . Abdominal hysterectomy    . Tonsillectomy    . Cataract extraction    . Cystostomy    . Cardiac surgery    . Coronary artery bypass graft  08/19/22  . Aortic valve replacement  08/10/11    Current Medications: Outpatient Prescriptions Prior to Visit  Medication Sig Dispense Refill  . acetaminophen (TYLENOL) 325 MG tablet Take 650 mg by mouth every 6 (six) hours as needed. For pain     . aspirin EC 325 MG tablet Take 325 mg by mouth daily.      . Calcium Carbonate (CALTRATE 600 PO) Take 1 tablet by mouth daily.       . carvedilol (COREG) 12.5 MG tablet take 1 tablet by mouth twice a day 180 tablet 0  . furosemide (LASIX) 40 MG tablet Take 1 tablet (40 mg total) by mouth every other day. Please keep your upcoming appointment. 45 tablet 0  . loratadine (CLARITIN) 10 MG tablet Take 10 mg by mouth daily.    Marland Kitchen omeprazole (PRILOSEC) 20 MG capsule take 1 capsule by mouth once daily 30 capsule 11  . potassium chloride SA (K-DUR,KLOR-CON) 20 MEQ tablet Take one tablet by mouth every other day 45 tablet 4  . rosuvastatin (CRESTOR) 10 MG tablet take 1 tablet by mouth once daily 30 tablet 0   No facility-administered medications prior to visit.     Allergies:   Fish allergy; Lipitor; Cheese; Morphine and related; Penicillins; and Sulfa antibiotics   Social History   Social History  . Marital Status: Widowed    Spouse Name: N/A  . Number of Children: N/A  . Years of Education: N/A   Social History Main Topics  . Smoking status: Never Smoker   . Smokeless tobacco: Never Used  . Alcohol Use: No  . Drug Use: No  . Sexual Activity: No   Other Topics Concern  . None   Social History Narrative     Family History:  The patient's    family history includes Heart attack in her father;  Heart failure in her father.   ROS:   Please see the history of present illness.    Review of Systems  Constitution: Positive for weakness.  HENT: Positive for hearing loss and hoarse voice.   Eyes: Negative.   Cardiovascular: Negative.   Respiratory: Negative.   Hematologic/Lymphatic: Negative.   Musculoskeletal: Positive for arthritis and muscle weakness. Negative for joint pain.  Gastrointestinal: Negative.   Genitourinary: Negative.   Neurological: Positive for disturbances in coordination and loss of balance.   All other systems reviewed and are negative.   PHYSICAL EXAM:   VS:  BP 118/78 mmHg  Pulse 81  Ht 5\' 4"  (1.626 m)  Wt 125 lb (56.7 kg)  BMI 21.45 kg/m2  SpO2 99%   GEN: Marianne working  82 year old,Well nourished, well developed, in no acute distress Neck: no JVD, carotid bruits, or masses Cardiac:  RRR; 1/6 systolic murmur at the left sternal border,no rubs, or gallops,no edema  Respiratory:  clear to auscultation bilaterally, normal work of breathing GI: soft, nontender, nondistended, + BS MS: no deformity or atrophy Skin: warm and dry, no rash Neuro:  Alert and Oriented x 3, Strength and sensation are intact Psych: euthymic mood, full affect  Wt Readings from Last 3 Encounters:  05/23/16 125 lb (56.7 kg)  03/03/15 127 lb (57.607 kg)  12/01/14 125 lb (56.7 kg)      Studies/Labs Reviewed:   EKG:  EKG is  ordered today.  The ekg ordered today demonstrates normal sinus rhythm with bifascicular block, inferior infarct, no acute change  Recent Labs: No results found for requested labs within last 365 days.   Lipid Panel    Component Value Date/Time   CHOL 134 11/12/2014 1033   TRIG 111 11/12/2014 1033   HDL 60 11/12/2014 1033   CHOLHDL 2.2 11/12/2014 1033   VLDL 22 11/12/2014 1033   LDLCALC 52 11/12/2014 1033    Additional studies/ records that were reviewed today include:  2-D echo 03/2015 Study Conclusions  - Left ventricle: The cavity size was normal. Wall thickness was   increased in a pattern of moderate LVH. There was focal basal   hypertrophy. Systolic function was normal. The estimated ejection   fraction was in the range of 50% to 55%. Wall motion was normal;   there were no regional wall motion abnormalities. Doppler   parameters are consistent with abnormal left ventricular   relaxation (grade 1 diastolic dysfunction). - Aortic valve: Mildly calcified annulus. - Mitral valve: There was mild regurgitation. - Left atrium: The atrium was mildly dilated.  -------------------------------------------------------------------    ASSESSMENT:    1. Atrial fibrillation, unspecified type ([place])   2. Coronary artery disease involving autologous  artery coronary bypass graft without angina pectoris   3. Aortic stenosis   4. Essential hypertension   5. Hyperlipidemia      PLAN:  In order of problems listed above:  Patient had atrial fibrillation postop converted to normal sinus rhythm and has maintained normal sinus rhythm  Prior CABG and aVR in 2012 doing well without symptoms. 2-D echo 03/2015 EF 50-55% with grade 1 DD. Continue Lasix every other day and Coreg.every noon patient handicap sticker and medications.followup with Dr. Marigene Ehlers in one year  Hypertension well-controlled  Hyperlipidemia patient needs renewal for Crestor. She has not had blood work in over a year. She did eat this morning. She sees her primary care tomorrow and will have blood work including fasting lipid panel. I asked that these labs be sent  to [place].   Medication Adjustments/Labs and Tests Ordered: Current medicines are reviewed at length with the patient today.  Concerns regarding medicines are outlined above.  Medication changes, Labs and Tests ordered today are listed in the Patient Instructions below. Patient Instructions  Your physician wants you to follow-up in: 1 Year with Dr. Aundra Dubin. You will receive a reminder letter in the mail two months in advance. If you don't receive a letter, please call our office to schedule the follow-up appointment.  Your physician recommends that you continue on your current medications as directed. Please refer to the Current Medication list given to you today.  If you need a refill on your cardiac medications before your next appointment, please call your pharmacy.  Thank you for choosing [facility]!       Sumner Boast, PA-C  05/23/2016 12:34 PM    [facility] [address] [place], [place], [place]  [address] Phone: [phone]; Fax: [phone]

## 2016-05-23 NOTE — Patient Instructions (Signed)
Your physician wants you to follow-up in: 1 Year with Dr. Aundra Dubin. You will receive a reminder letter in the mail two months in advance. If you don't receive a letter, please call our office to schedule the follow-up appointment.  Your physician recommends that you continue on your current medications as directed. Please refer to the Current Medication list given to you today.  If you need a refill on your cardiac medications before your next appointment, please call your pharmacy.  Thank you for choosing [facility]!

## 2016-05-24 DIAGNOSIS — Z1389 Encounter for screening for other disorder: Secondary | ICD-10-CM | POA: Diagnosis not present

## 2016-05-24 DIAGNOSIS — Z0001 Encounter for general adult medical examination with abnormal findings: Secondary | ICD-10-CM | POA: Diagnosis not present

## 2016-05-24 DIAGNOSIS — I10 Essential (primary) hypertension: Secondary | ICD-10-CM | POA: Diagnosis not present

## 2016-05-24 DIAGNOSIS — E782 Mixed hyperlipidemia: Secondary | ICD-10-CM | POA: Diagnosis not present

## 2016-05-24 DIAGNOSIS — Z23 Encounter for immunization: Secondary | ICD-10-CM | POA: Diagnosis not present

## 2016-05-24 DIAGNOSIS — Z6822 Body mass index (BMI) 22.0-22.9, adult: Secondary | ICD-10-CM | POA: Diagnosis not present

## 2016-05-24 DIAGNOSIS — I2510 Atherosclerotic heart disease of native coronary artery without angina pectoris: Secondary | ICD-10-CM | POA: Diagnosis not present

## 2016-06-08 ENCOUNTER — Other Ambulatory Visit: Payer: Self-pay | Admitting: Cardiology

## 2016-06-28 ENCOUNTER — Other Ambulatory Visit: Payer: Self-pay | Admitting: *Deleted

## 2016-06-28 MED ORDER — CARVEDILOL 12.5 MG PO TABS: 12.5000 mg | ORAL_TABLET | Freq: Two times a day (BID) | ORAL | Status: DC

## 2016-09-26 ENCOUNTER — Other Ambulatory Visit (INDEPENDENT_AMBULATORY_CARE_PROVIDER_SITE_OTHER): Payer: Self-pay | Admitting: Internal Medicine

## 2016-11-06 ENCOUNTER — Other Ambulatory Visit: Payer: Self-pay | Admitting: Cardiology

## 2016-12-19 ENCOUNTER — Other Ambulatory Visit: Payer: Self-pay | Admitting: Cardiology

## 2017-06-11 ENCOUNTER — Other Ambulatory Visit: Payer: Self-pay | Admitting: Physician Assistant

## 2017-07-08 ENCOUNTER — Telehealth: Payer: Self-pay | Admitting: Physician Assistant

## 2017-07-08 MED ORDER — CARVEDILOL 12.5 MG PO TABS: 12.5000 mg | ORAL_TABLET | Freq: Two times a day (BID) | ORAL | 3 refills | Status: DC

## 2017-07-08 MED ORDER — POTASSIUM CHLORIDE CRYS ER 20 MEQ PO TBCR: EXTENDED_RELEASE_TABLET | ORAL | 3 refills | Status: DC

## 2017-07-08 MED ORDER — ROSUVASTATIN CALCIUM 10 MG PO TABS: 10.0000 mg | ORAL_TABLET | Freq: Every day | ORAL | 0 refills | Status: DC

## 2017-07-08 MED ORDER — FUROSEMIDE 40 MG PO TABS: ORAL_TABLET | ORAL | 0 refills | Status: DC

## 2017-07-08 NOTE — Telephone Encounter (Signed)
°*  STAT* If patient is at the pharmacy, call can be transferred to refill team.   1. Which medications need to be refilled? (please list name of each medication and dose if known) rosuvastatin (CRESTOR) 10 MG tablet [[id number]]   carvedilol (COREG) 12.5 MG tablet [[id number]]   potassium chloride SA (K-DUR,KLOR-CON) 20 MEQ tablet [[id number]]   furosemide (LASIX) 40 MG tablet [[id number]]       2. Which pharmacy/location (including street and city if local pharmacy) is medication to be sent to? [facility]  3. Do they need a 30 day or 90 day supply?  Pt is scheduled for an apt to see ML on 07/31/17

## 2017-07-08 NOTE — Telephone Encounter (Signed)
Refilled prescriptions.

## 2017-07-31 ENCOUNTER — Ambulatory Visit (INDEPENDENT_AMBULATORY_CARE_PROVIDER_SITE_OTHER): Payer: Medicare Other | Admitting: Physician Assistant

## 2017-07-31 ENCOUNTER — Encounter: Payer: Self-pay | Admitting: Physician Assistant

## 2017-07-31 ENCOUNTER — Other Ambulatory Visit (HOSPITAL_COMMUNITY)
Admission: RE | Admit: 2017-07-31 | Discharge: 2017-07-31 | Disposition: A | Discharge status: Home / Self Care | Payer: Medicare Other | Source: Ambulatory Visit | Attending: Physician Assistant | Admitting: Physician Assistant

## 2017-07-31 VITALS — BP 122/76 | HR 96 | Ht 64.0 in | Wt 125.0 lb

## 2017-07-31 DIAGNOSIS — E785 Hyperlipidemia, unspecified: Secondary | ICD-10-CM

## 2017-07-31 DIAGNOSIS — Z952 Presence of prosthetic heart valve: Secondary | ICD-10-CM

## 2017-07-31 DIAGNOSIS — I10 Essential (primary) hypertension: Secondary | ICD-10-CM | POA: Diagnosis present

## 2017-07-31 DIAGNOSIS — I25810 Atherosclerosis of coronary artery bypass graft(s) without angina pectoris: Secondary | ICD-10-CM

## 2017-07-31 DIAGNOSIS — I480 Paroxysmal atrial fibrillation: Secondary | ICD-10-CM

## 2017-07-31 LAB — COMPREHENSIVE METABOLIC PANEL
ALBUMIN: 3.6 g/dL (ref 3.5–5.0)
ALK PHOS: 56 U/L (ref 38–126)
ALT: 12 U/L — ABNORMAL LOW (ref 14–54)
ANION GAP: 9 (ref 5–15)
AST: 15 U/L (ref 15–41)
BILIRUBIN TOTAL: 0.4 mg/dL (ref 0.3–1.2)
BUN: 32 mg/dL — AB (ref 6–20)
CO2: 27 mmol/L (ref 22–32)
Calcium: 9.5 mg/dL (ref 8.9–10.3)
Chloride: 104 mmol/L (ref 101–111)
Creatinine, Ser: 1.86 mg/dL — ABNORMAL HIGH (ref 0.44–1.00)
GFR calc Af Amer: 26 mL/min — ABNORMAL LOW (ref >60)
GFR calc non Af Amer: 22 mL/min — ABNORMAL LOW (ref >60)
GLUCOSE: 99 mg/dL (ref 65–99)
POTASSIUM: 4.4 mmol/L (ref 3.5–5.1)
SODIUM: 140 mmol/L (ref 135–145)
TOTAL PROTEIN: 6.7 g/dL (ref 6.5–8.1)

## 2017-07-31 LAB — CBC WITH DIFFERENTIAL/PLATELET
BASOS ABS: 0.1 10*3/uL (ref 0.0–0.1)
BASOS PCT: 1 %
Eosinophils Absolute: 0.1 10*3/uL (ref 0.0–0.7)
Eosinophils Relative: 2 %
HEMATOCRIT: 37.7 % (ref 36.0–46.0)
Hemoglobin: 11.9 g/dL — ABNORMAL LOW (ref 12.0–15.0)
LYMPHS PCT: 20 %
Lymphs Abs: 1.5 10*3/uL (ref 0.7–4.0)
MCH: 28.6 pg (ref 26.0–34.0)
MCHC: 31.6 g/dL (ref 30.0–36.0)
MCV: 90.6 fL (ref 78.0–100.0)
Monocytes Absolute: 0.4 10*3/uL (ref 0.1–1.0)
Monocytes Relative: 5 %
NEUTROS ABS: 5.5 10*3/uL (ref 1.7–7.7)
NEUTROS PCT: 72 %
Platelets: 257 10*3/uL (ref 150–400)
RBC: 4.16 MIL/uL (ref 3.87–5.11)
RDW: 13.4 % (ref 11.5–15.5)
WBC: 7.5 10*3/uL (ref 4.0–10.5)

## 2017-07-31 LAB — LIPID PANEL
CHOL/HDL RATIO: 2.2 ratio
Cholesterol: 149 mg/dL (ref 0–200)
HDL: 68 mg/dL (ref >40)
LDL CALC: 53 mg/dL (ref 0–99)
Triglycerides: 140 mg/dL (ref <150)
VLDL: 28 mg/dL (ref 0–40)

## 2017-07-31 LAB — TSH: TSH: 2.102 u[IU]/mL (ref 0.350–4.500)

## 2017-07-31 NOTE — Progress Notes (Signed)
Cardiology Office Note    Date:  07/31/2017   ID:  [name], [name] 1922-03-11, MRN [id number]  PCP:  Elsie Lincoln, MD  Cardiologist: Previously Dr. Estrella Deeds To be followed by Dr. Harrington Challenger  Chief Complaint  Patient presents with  . Follow-up    History of Present Illness:  [name] is a 83 y.o. female here with her daughter for yearly followup. Patient has history of CABG/aVR with LIMA to the LAD and bioprosthetic aVR in 08/2011. EF initially was 20% with global hypokinesis but repeat echo EF improved to 45-50%.cardiomyopathy was felt secondary to aortic stenosis. Last 2-D echo 2016 LVEF 50-55% with no wall motion abnormalities and grade 1 DD. She had postop atrial fibrillation converted to normal sinus rhythm with amiodarone, hypertension, hyperlipidemia and erosive esophagitis. She sees Dr. Delrae Sawyers for CKD.  I saw her a year ago and she was still living independently and driving.   Today patient comes in once again with her daughter. She is still living alone and driving. Her daughter checks on her daily and lives close by. She has had no changes in her health in the past year. She does use her walker a little bit more than she used to. She denies any chest pain, palpitations, dyspnea, dyspnea on exertion, dizziness or presyncope. She hasn't had lab work in over 2 years. She is hard of hearing and has to have her hearing aids replaced which will cost her $12,000.    Past Medical History:  Diagnosis Date  . Anemia   . Aortic stenosis   . CAD (coronary artery disease)   . CHF (congestive heart failure) ([place])   . GERD (gastroesophageal reflux disease)   . Hypertension     Past Surgical History:  Procedure Laterality Date  . ABDOMINAL HYSTERECTOMY    . AORTIC VALVE REPLACEMENT  08/10/11  . CARDIAC SURGERY    . CATARACT EXTRACTION    . CORONARY ARTERY BYPASS GRAFT  08/19/22  . CYSTOSTOMY    . HERNIA REPAIR    . TONSILLECTOMY      Current  Medications: Current Meds  Medication Sig  . acetaminophen (TYLENOL) 325 MG tablet Take 650 mg by mouth every 6 (six) hours as needed. For pain   . aspirin EC 325 MG tablet Take 325 mg by mouth daily.    . Calcium Carbonate (CALTRATE 600 PO) Take 1 tablet by mouth daily.    . carvedilol (COREG) 12.5 MG tablet Take 1 tablet (12.5 mg total) by mouth 2 (two) times daily.  . furosemide (LASIX) 40 MG tablet TAKE 1 TABLET(40 MG TOTAL) BY MOUTH EVERY OTHER DAY  . loratadine (CLARITIN) 10 MG tablet Take 10 mg by mouth daily.  Marland Kitchen omeprazole (PRILOSEC) 20 MG capsule take 1 capsule by mouth once daily  . potassium chloride SA (K-DUR,KLOR-CON) 20 MEQ tablet TAKE ONE TABLET EVERY OTHER DAY  . rosuvastatin (CRESTOR) 10 MG tablet Take 1 tablet (10 mg total) by mouth daily.     Allergies:   Fish allergy; Lipitor [atorvastatin calcium]; Cheese; Morphine and related; Penicillins; and Sulfa antibiotics   Social History   Social History  . Marital status: Widowed    Spouse name: N/A  . Number of children: N/A  . Years of education: N/A   Social History Main Topics  . Smoking status: Never Smoker  . Smokeless tobacco: Never Used  . Alcohol use No  . Drug use: No  . Sexual activity: No   Other Topics Concern  .  None   Social History Narrative  . None     Family History:  The patient's family history includes Heart attack in her father; Heart failure in her father.   ROS:   Please see the history of present illness.    Review of Systems  Constitution: Negative.  HENT: Positive for hearing loss.   Eyes: Negative.   Cardiovascular: Negative.   Respiratory: Negative.   Hematologic/Lymphatic: Bruises/bleeds easily.  Musculoskeletal: Positive for arthritis. Negative for joint pain.  Gastrointestinal: Negative.   Genitourinary: Negative.   Neurological: Negative.    All other systems reviewed and are negative.   PHYSICAL EXAM:   VS:  BP 122/76   Pulse 96   Ht 5\' 4"  (1.626 m)   Wt 125 lb  (56.7 kg)   SpO2 (!) 74%   BMI 21.46 kg/m   Physical Exam  GEN: Well nourished, well developed, in no acute distress  Neck: no JVD, carotid bruits, or masses Cardiac:RRR; no murmurs, rubs, or gallops  Respiratory:  clear to auscultation bilaterally, normal work of breathing GI: soft, nontender, nondistended, + BS Ext: without cyanosis, clubbing, or edema, Good distal pulses bilaterally Neuro:  Alert and Oriented x 3 Psych: euthymic mood, full affect  Wt Readings from Last 3 Encounters:  07/31/17 125 lb (56.7 kg)  05/23/16 125 lb (56.7 kg)  03/03/15 127 lb (57.6 kg)      Studies/Labs Reviewed:   EKG:  EKG is ordered today.  The ekg ordered today demonstrates NSR with IRBBB, no acute change from prior tracings.  Recent Labs: No results found for requested labs within last 8760 hours.   Lipid Panel    Component Value Date/Time   CHOL 134 11/12/2014 1033   TRIG 111 11/12/2014 1033   HDL 60 11/12/2014 1033   CHOLHDL 2.2 11/12/2014 1033   VLDL 22 11/12/2014 1033   LDLCALC 52 11/12/2014 1033    Additional studies/ records that were reviewed today include:  Echo 03/09/15 Study Conclusions  - Left ventricle: The cavity size was normal. Wall thickness was   increased in a pattern of moderate LVH. There was focal basal   hypertrophy. Systolic function was normal. The estimated ejection   fraction was in the range of 50% to 55%. Wall motion was normal;   there were no regional wall motion abnormalities. Doppler   parameters are consistent with abnormal left ventricular   relaxation (grade 1 diastolic dysfunction). - Aortic valve: Mildly calcified annulus. - Mitral valve: There was mild regurgitation. - Left atrium: The atrium was mildly dilated.     ASSESSMENT:    1. Coronary artery disease involving autologous artery coronary bypass graft without angina pectoris   2. S/P AVR (aortic valve replacement)   3. Essential hypertension   4. Hyperlipidemia, unspecified  hyperlipidemia type   5. Paroxysmal atrial fibrillation (HCC)      PLAN:  In order of problems listed above:  CAD S/P CABG and AVR 2012. Echo 2016 EF 50-55% grade 1 DD.Doing well without symptoms continue low-dose carvedilol, aspirin, Crestor, Lasix and potassium every other day. We'll check complete labs today including CBC, CMET, TSH. Patient would like to be followed by Dr. Harrington Challenger in the future. We'll schedule a yearly follow-up with her.  Essential HTN well-controlled  Hyperlipidemia on Crestor check fasting lipid panel today  PAF occurred post op with no reoccurrence since-normal sinus rhythm today    Medication Adjustments/Labs and Tests Ordered: Current medicines are reviewed at length with the patient today.  Concerns regarding medicines are outlined above.  Medication changes, Labs and Tests ordered today are listed in the Patient Instructions below. Patient Instructions  Medication Instructions:  Your physician recommends that you continue on your current medications as directed. Please refer to the Current Medication list given to you today.   Labwork: Your physician recommends that you return for lab work in: Fasting    Testing/Procedures: NONE   Follow-Up: Your physician wants you to follow-up in: 1 Year with Dr. Harrington Challenger. You will receive a reminder letter in the mail two months in advance. If you don't receive a letter, please call our office to schedule the follow-up appointment.   Any Other Special Instructions Will Be Listed Below (If Applicable).     If you need a refill on your cardiac medications before your next appointment, please call your pharmacy.      Sumner Boast, PA-C  07/31/2017 11:48 AM    [facility] [place], [place], [place]  [address] Phone: [phone]; Fax: [phone]

## 2017-07-31 NOTE — Patient Instructions (Signed)
Medication Instructions:  Your physician recommends that you continue on your current medications as directed. Please refer to the Current Medication list given to you today.   Labwork: Your physician recommends that you return for lab work in: Fasting    Testing/Procedures: NONE   Follow-Up: Your physician wants you to follow-up in: 1 Year with Dr. Harrington Challenger. You will receive a reminder letter in the mail two months in advance. If you don't receive a letter, please call our office to schedule the follow-up appointment.   Any Other Special Instructions Will Be Listed Below (If Applicable).     If you need a refill on your cardiac medications before your next appointment, please call your pharmacy.

## 2017-08-27 ENCOUNTER — Other Ambulatory Visit: Payer: Self-pay | Admitting: Physician Assistant

## 2017-08-28 ENCOUNTER — Other Ambulatory Visit: Payer: Self-pay | Admitting: *Deleted

## 2017-08-28 MED ORDER — ROSUVASTATIN CALCIUM 10 MG PO TABS: 10.0000 mg | ORAL_TABLET | Freq: Every day | ORAL | 3 refills | Status: DC

## 2017-10-21 ENCOUNTER — Other Ambulatory Visit (INDEPENDENT_AMBULATORY_CARE_PROVIDER_SITE_OTHER): Payer: Self-pay | Admitting: Internal Medicine

## 2017-10-21 ENCOUNTER — Telehealth (INDEPENDENT_AMBULATORY_CARE_PROVIDER_SITE_OTHER): Payer: Self-pay | Admitting: Internal Medicine

## 2017-10-21 NOTE — Telephone Encounter (Signed)
PHarmacy can send request

## 2017-10-21 NOTE — Telephone Encounter (Signed)
Patient's daughter Bethena Roys called, stated that the patient needs a refill on Omeprozole.  [phone] - Judy's #, pt's landline is not working

## 2017-10-22 NOTE — Telephone Encounter (Signed)
I called the patient's daughter and told her Terri's response, she stated that the pharmacy has faxed a request to [place].

## 2017-12-05 ENCOUNTER — Other Ambulatory Visit: Payer: Self-pay

## 2017-12-05 MED ORDER — FUROSEMIDE 40 MG PO TABS
ORAL_TABLET | ORAL | 6 refills | Status: DC
Start: 2017-12-05 — End: 2018-10-24

## 2017-12-05 NOTE — Telephone Encounter (Signed)
Lasix refilled per fax request from [facility]

## 2017-12-25 ENCOUNTER — Encounter (INDEPENDENT_AMBULATORY_CARE_PROVIDER_SITE_OTHER): Payer: Self-pay | Admitting: Internal Medicine

## 2018-01-13 ENCOUNTER — Ambulatory Visit (INDEPENDENT_AMBULATORY_CARE_PROVIDER_SITE_OTHER): Payer: Medicare Other | Admitting: Internal Medicine

## 2018-01-14 ENCOUNTER — Telehealth (INDEPENDENT_AMBULATORY_CARE_PROVIDER_SITE_OTHER): Payer: Self-pay | Admitting: Internal Medicine

## 2018-01-14 NOTE — Telephone Encounter (Signed)
Patient's daughter called, lmom during lunch and stated that she needed to check on her mom's appointment.  She thought it was unusual that a doctor's office scheduled the appointment and not the patient herself.  I called the daughter, [name] back and explained that this was not abnormal for our office.  Our provider stated that the patient will need a follow up appointment prior to anymore medication refills so that's how the appointment was generated.  She questioned the over the counter medication, but knows the script is cheaper.  She cancelled her moms appointment until she can coordinate a time with her spouse's work schedule and will call back to reschedule.

## 2018-01-22 ENCOUNTER — Ambulatory Visit (INDEPENDENT_AMBULATORY_CARE_PROVIDER_SITE_OTHER): Payer: Medicare Other | Admitting: Internal Medicine

## 2018-07-28 ENCOUNTER — Other Ambulatory Visit: Payer: Self-pay

## 2018-07-28 ENCOUNTER — Telehealth: Payer: Self-pay | Admitting: Internal Medicine

## 2018-07-28 MED ORDER — POTASSIUM CHLORIDE CRYS ER 20 MEQ PO TBCR: EXTENDED_RELEASE_TABLET | ORAL | 0 refills | Status: DC

## 2018-07-28 NOTE — Telephone Encounter (Signed)
error 

## 2018-08-07 ENCOUNTER — Other Ambulatory Visit: Payer: Self-pay | Admitting: Physician Assistant

## 2018-09-05 ENCOUNTER — Ambulatory Visit: Payer: Medicare Other | Admitting: Internal Medicine

## 2018-10-08 ENCOUNTER — Other Ambulatory Visit: Payer: Self-pay | Admitting: Physician Assistant

## 2018-10-10 ENCOUNTER — Encounter: Payer: Self-pay | Admitting: Physician Assistant

## 2018-10-10 ENCOUNTER — Ambulatory Visit (INDEPENDENT_AMBULATORY_CARE_PROVIDER_SITE_OTHER): Payer: Medicare Other | Admitting: Physician Assistant

## 2018-10-10 VITALS — BP 128/68 | HR 77 | Ht 64.0 in | Wt 125.0 lb

## 2018-10-10 DIAGNOSIS — K2210 Ulcer of esophagus without bleeding: Secondary | ICD-10-CM

## 2018-10-10 DIAGNOSIS — I5032 Chronic diastolic (congestive) heart failure: Secondary | ICD-10-CM

## 2018-10-10 DIAGNOSIS — I10 Essential (primary) hypertension: Secondary | ICD-10-CM

## 2018-10-10 DIAGNOSIS — E785 Hyperlipidemia, unspecified: Secondary | ICD-10-CM | POA: Diagnosis not present

## 2018-10-10 DIAGNOSIS — N183 Chronic kidney disease, stage 3 unspecified: Secondary | ICD-10-CM

## 2018-10-10 DIAGNOSIS — I480 Paroxysmal atrial fibrillation: Secondary | ICD-10-CM | POA: Diagnosis not present

## 2018-10-10 DIAGNOSIS — I25810 Atherosclerosis of coronary artery bypass graft(s) without angina pectoris: Secondary | ICD-10-CM

## 2018-10-10 MED ORDER — ASPIRIN EC 81 MG PO TBEC: 81.0000 mg | DELAYED_RELEASE_TABLET | Freq: Every day | ORAL | 3 refills | Status: AC

## 2018-10-10 NOTE — Patient Instructions (Addendum)
Decrease aspirin to 81 mg daily  Take Lasix and potassium 2 x week, on Monday and Thursday Weigh daily and record. Take an extra Lasix and potassium tablet for weight gain of 3 lbs in a day or 5 lbs in a week.  Take Mylanta at bedtime Medication Instructions:  Your physician recommends that you continue on your current medications as directed. Please refer to the Current Medication list given to you today.   Labwork: NONE   Testing/Procedures: NONE   Follow-Up: Your physician wants you to follow-up in: 1 Year. You will receive a reminder letter in the mail two months in advance. If you don't receive a letter, please call our office to schedule the follow-up appointment.   Any Other Special Instructions Will Be Listed Below (If Applicable).  Thank you for choosing [facility]!     If you need a refill on your cardiac medications before your next appointment, please call your pharmacy.   Low-Sodium Eating Plan Sodium, which is an element that makes up salt, helps you maintain a healthy balance of fluids in your body. Too much sodium can increase your blood pressure and cause fluid and waste to be held in your body. Your health care provider or dietitian may recommend following this plan if you have high blood pressure (hypertension), kidney disease, liver disease, or heart failure. Eating less sodium can help lower your blood pressure, reduce swelling, and protect your heart, liver, and kidneys. What are tips for following this plan? General guidelines  Most people on this plan should limit their sodium intake to 1,500-2,000 mg (milligrams) of sodium each day. Reading food labels  The Nutrition Facts label lists the amount of sodium in one serving of the food. If you eat more than one serving, you must multiply the listed amount of sodium by the number of servings.  Choose foods with less than 140 mg of sodium per serving.  Avoid foods with 300 mg of sodium or  more per serving. Shopping  Look for lower-sodium products, often labeled as "low-sodium" or "no salt added."  Always check the sodium content even if foods are labeled as "unsalted" or "no salt added".  Buy fresh foods. ? Avoid canned foods and premade or frozen meals. ? Avoid canned, cured, or processed meats  Buy breads that have less than 80 mg of sodium per slice. Cooking  Eat more home-cooked food and less restaurant, buffet, and fast food.  Avoid adding salt when cooking. Use salt-free seasonings or herbs instead of table salt or sea salt. Check with your health care provider or pharmacist before using salt substitutes.  Cook with plant-based oils, such as canola, sunflower, or olive oil. Meal planning  When eating at a restaurant, ask that your food be prepared with less salt or no salt, if possible.  Avoid foods that contain MSG (monosodium glutamate). MSG is sometimes added to [place] food, bouillon, and some canned foods. What foods are recommended? The items listed may not be a complete list. Talk with your dietitian about what dietary choices are best for you. Grains Low-sodium cereals, including oats, puffed wheat and rice, and shredded wheat. Low-sodium crackers. Unsalted rice. Unsalted pasta. Low-sodium bread. Whole-grain breads and whole-grain pasta. Vegetables Fresh or frozen vegetables. "No salt added" canned vegetables. "No salt added" tomato sauce and paste. Low-sodium or reduced-sodium tomato and vegetable juice. Fruits Fresh, frozen, or canned fruit. Fruit juice. Meats and other protein foods Fresh or frozen (no salt added) meat, poultry, seafood,  and fish. Low-sodium canned tuna and salmon. Unsalted nuts. Dried peas, beans, and lentils without added salt. Unsalted canned beans. Eggs. Unsalted nut butters. Dairy Milk. Soy milk. Cheese that is naturally low in sodium, such as ricotta cheese, fresh mozzarella, or Swiss cheese Low-sodium or reduced-sodium  cheese. Cream cheese. Yogurt. Fats and oils Unsalted butter. Unsalted margarine with no trans fat. Vegetable oils such as canola or olive oils. Seasonings and other foods Fresh and dried herbs and spices. Salt-free seasonings. Low-sodium mustard and ketchup. Sodium-free salad dressing. Sodium-free light mayonnaise. Fresh or refrigerated horseradish. Lemon juice. Vinegar. Homemade, reduced-sodium, or low-sodium soups. Unsalted popcorn and pretzels. Low-salt or salt-free chips. What foods are not recommended? The items listed may not be a complete list. Talk with your dietitian about what dietary choices are best for you. Grains Instant hot cereals. Bread stuffing, pancake, and biscuit mixes. Croutons. Seasoned rice or pasta mixes. Noodle soup cups. Boxed or frozen macaroni and cheese. Regular salted crackers. Self-rising flour. Vegetables Sauerkraut, pickled vegetables, and relishes. Olives. [place] fries. Onion rings. Regular canned vegetables (not low-sodium or reduced-sodium). Regular canned tomato sauce and paste (not low-sodium or reduced-sodium). Regular tomato and vegetable juice (not low-sodium or reduced-sodium). Frozen vegetables in sauces. Meats and other protein foods Meat or fish that is salted, canned, smoked, spiced, or pickled. Bacon, ham, sausage, hotdogs, corned beef, chipped beef, packaged lunch meats, salt pork, jerky, pickled herring, anchovies, regular canned tuna, sardines, salted nuts. Dairy Processed cheese and cheese spreads. Cheese curds. Blue cheese. Feta cheese. String cheese. Regular cottage cheese. Buttermilk. Canned milk. Fats and oils Salted butter. Regular margarine. Ghee. Bacon fat. Seasonings and other foods Onion salt, garlic salt, seasoned salt, table salt, and sea salt. Canned and packaged gravies. Worcestershire sauce. Tartar sauce. Barbecue sauce. Teriyaki sauce. Soy sauce, including reduced-sodium. Steak sauce. Fish sauce. Oyster sauce. Cocktail sauce.  Horseradish that you find on the shelf. Regular ketchup and mustard. Meat flavorings and tenderizers. Bouillon cubes. Hot sauce and Tabasco sauce. Premade or packaged marinades. Premade or packaged taco seasonings. Relishes. Regular salad dressings. Salsa. Potato and tortilla chips. Corn chips and puffs. Salted popcorn and pretzels. Canned or dried soups. Pizza. Frozen entrees and pot pies. Summary  Eating less sodium can help lower your blood pressure, reduce swelling, and protect your heart, liver, and kidneys.  Most people on this plan should limit their sodium intake to 1,500-2,000 mg (milligrams) of sodium each day.  Canned, boxed, and frozen foods are high in sodium. Restaurant foods, fast foods, and pizza are also very high in sodium. You also get sodium by adding salt to food.  Try to cook at home, eat more fresh fruits and vegetables, and eat less fast food, canned, processed, or prepared foods. This information is not intended to replace advice given to you by your health care provider. Make sure you discuss any questions you have with your health care provider. Document Released: 06/08/2002 Document Revised: 12/10/2016 Document Reviewed: 12/10/2016 Elsevier Interactive Patient Education  [employer].

## 2018-10-10 NOTE — Progress Notes (Signed)
Cardiology Office Note   Date:  10/10/2018   ID:  Syretta, Kochel [birth year], MRN [id number]  PCP:  Elsie Lincoln, MD Cardiologist:  Dorris Carnes, MD assigned 07/2017 M. [facility], [facility] 07/31/2017 Rosaria Ferries, PA-C    History of Present Illness: [name] is a 84 y.o. female with a history of CABG/aVR with LIMA to the LAD and bioprosthetic aVR in 08/2011. EF 20% with global hypokinesis>>EF improved to 45-50%.CM felt 2nd AS. 2-D echo 2016 LVEF 50-55% with no wall motion abnormalities and grade 1 DD. postop atrial fibrillation>>SR w/ amio, HTN, HLD, erosive esophagitis, CKD III - Dr. Delrae Sawyers  07/2017 office visit, patient using a walker but still drives, labs checked, blood pressure controlled and patient in sinus rhythm  [name] presents for cardiology follow up. She is here with her daughter.  She is very HOH, daughter helps w/ communication.   She only gets chest pain when she lays down at night, takes Prilosec each am.  Denies new DOE, does laps in the house every day. She does her laundry. She likes to get out. She uses a walker because of balance problems.  She takes the Lasix in the afternoon, unclear reasons. Denies waking w/ LE edema, has nocturia but no PND or orthopnea.   No palpitations, does not think she has had any Afib.   Cannot stand to wear stockings, her legs have been extremely tender every since her CABG.   Past Medical History:  Diagnosis Date  . Anemia   . Aortic stenosis   . CAD (coronary artery disease)   . CHF (congestive heart failure) ([place])   . GERD (gastroesophageal reflux disease)   . Hypertension     Past Surgical History:  Procedure Laterality Date  . ABDOMINAL HYSTERECTOMY    . AORTIC VALVE REPLACEMENT  08/10/11  . CARDIAC SURGERY    . CATARACT EXTRACTION    . CORONARY ARTERY BYPASS GRAFT  08/19/22  . CYSTOSTOMY    . HERNIA REPAIR    . TONSILLECTOMY      Current Outpatient Medications  Medication Sig  Dispense Refill  . acetaminophen (TYLENOL) 325 MG tablet Take 650 mg by mouth every 6 (six) hours as needed. For pain     . aspirin EC 325 MG tablet Take 325 mg by mouth daily.      . Calcium Carbonate (CALTRATE 600 PO) Take 1 tablet by mouth daily.      . carvedilol (COREG) 12.5 MG tablet TAKE 1 TABLET BY MOUTH TWICE DAILY 180 tablet 3  . furosemide (LASIX) 40 MG tablet TAKE 1 TABLET(40 MG TOTAL) BY MOUTH EVERY OTHER DAY 45 tablet 6  . loratadine (CLARITIN) 10 MG tablet Take 10 mg by mouth daily.    Marland Kitchen omeprazole (PRILOSEC) 20 MG capsule take 1 capsule by mouth once daily 30 capsule 0  . potassium chloride SA (K-DUR,KLOR-CON) 20 MEQ tablet TAKE ONE TABLET EVERY OTHER DAY-Please call and schedule follow up office visit to receive further refills. Thank you. [phone] 15 tablet 0  . rosuvastatin (CRESTOR) 10 MG tablet TAKE 1 TABLET BY MOUTH ONCE DAILY 90 tablet 0   No current facility-administered medications for this visit.     Allergies:   Fish allergy; Lipitor [atorvastatin calcium]; Cheese; Morphine and related; Penicillins; and Sulfa antibiotics    Social History:  The patient  reports that she has never smoked. She has never used smokeless tobacco. She reports that she does not drink alcohol or use  drugs.   Family History:  The patient's family history includes Heart attack in her father; Heart failure in her father.  She indicated that her mother is deceased. She indicated that her father is deceased. She indicated that her daughter is alive. She indicated that her son is alive. She indicated that her other is alive.    ROS:  Please see the history of present illness. All other systems are reviewed and negative.    PHYSICAL EXAM: VS:  BP 128/68   Pulse 77   Ht 5\' 4"  (1.626 m)   Wt 125 lb (56.7 kg)   SpO2 97%   BMI 21.46 kg/m  , BMI Body mass index is 21.46 kg/m. GEN: Well nourished, well developed, female in no acute distress HEENT: normal for age  Neck: no JVD, no  carotid bruit, no masses Cardiac: RRR; soft murmur, no rubs, or gallops Respiratory: decreased BS bases but clear to auscultation bilaterally, normal work of breathing GI: soft, nontender, nondistended, + BS MS: no deformity or atrophy; 1+ pedal edema but stockings are very tight around her ankle; distal pulses are 2+ in all 4 extremities  Skin: warm and dry, no rash, skin is very scaly Neuro:  Strength and sensation are intact Psych: euthymic mood, full affect   EKG:  EKG is ordered today. The ekg ordered today demonstrates SR, RBBB is old, LAFB, HR 78, no acute ischemic changes, since 2018, lateral R waves are lost  ECHO: 2016 - Left ventricle: The cavity size was normal. Wall thickness was increased in a pattern of moderate LVH. There was focal basal hypertrophy. Systolic function was normal. The estimated ejection fraction was in the range of 50% to 55%. Wall motion was normal; there were no regional wall motion abnormalities. Doppler parameters are consistent with abnormal left ventricular relaxation (grade 1 diastolic dysfunction). - Aortic valve: Mildly calcified annulus. - Mitral valve: There was mild regurgitation. - Left atrium: The atrium was mildly dilated.  Recent Labs: No results found for requested labs within last 8760 hours.  CBC    Component Value Date/Time   WBC 7.5 07/31/2017 1200   RBC 4.16 07/31/2017 1200   HGB 11.9 (L) 07/31/2017 1200   HCT 37.7 07/31/2017 1200   PLT 257 07/31/2017 1200   MCV 90.6 07/31/2017 1200   MCH 28.6 07/31/2017 1200   MCHC 31.6 07/31/2017 1200   RDW 13.4 07/31/2017 1200   LYMPHSABS 1.5 07/31/2017 1200   MONOABS 0.4 07/31/2017 1200   EOSABS 0.1 07/31/2017 1200   BASOSABS 0.1 07/31/2017 1200   CMP Latest Ref Rng & Units 07/31/2017 11/12/2014 05/05/2013  Glucose 65 - 99 mg/dL 99 - 88  BUN 6 - 20 mg/dL 32(H) - 42(H)  Creatinine 0.44 - 1.00 mg/dL 1.86(H) - 2.6(H)  Sodium 135 - 145 mmol/L 140 - 139  Potassium 3.5 - 5.1  mmol/L 4.4 - 4.0  Chloride 101 - 111 mmol/L 104 - 104  CO2 22 - 32 mmol/L 27 - 27  Calcium 8.9 - 10.3 mg/dL 9.5 - 9.8  Total Protein 6.5 - 8.1 g/dL 6.7 6.4 -  Total Bilirubin 0.3 - 1.2 mg/dL 0.4 0.4 -  Alkaline Phos 38 - 126 U/L 56 58 -  AST 15 - 41 U/L 15 13 -  ALT 14 - 54 U/L 12(L) 9 -     Lipid Panel    Component Value Date/Time   CHOL 149 07/31/2017 1200   TRIG 140 07/31/2017 1200   HDL 68 07/31/2017 1200  CHOLHDL 2.2 07/31/2017 1200   VLDL 28 07/31/2017 1200   LDLCALC 53 07/31/2017 1200     Wt Readings from Last 3 Encounters:  10/10/18 125 lb (56.7 kg)  07/31/17 125 lb (56.7 kg)  05/23/16 125 lb (56.7 kg)     Other studies Reviewed: Additional studies/ records that were reviewed today include: Office notes, hospital records and testing.  ASSESSMENT AND PLAN:  1.  CAD: no ischemic sx, no testing. - continue ASA, BB, statin - ECG w/ some changes from 2018, but not clearly ischemic.  - continue to follow  2. PAF: no known recurrence. - continue BB - she is on high-dose ASA, will decrease to 81 mg qd  3. Hyperlipidemia: goal LDL < 70 - she was at goal last year, recheck w/ CMET and CBC  4. Erosive esophagitis:  - ck CBC, no obvious bleeding - start Mylanta at bedtime, continue Prilosec  5. Chronic diastolic CHF:  - Wt stable - ok to decrease the Lasix to 2 x weekly, prn dose for wt gain - start daily wts and record  6. CKD III - ck BMET today  7. HTN: BP good today, no med changes  Current medicines are reviewed at length with the patient today.  The patient has concerns regarding medicines. Concerns were addressed  The following changes have been made:  Decrease Lasix w/ PRN doses added, Mylanta at bedtime  Labs/ tests ordered today include:   Orders Placed This Encounter  Procedures  . EKG 12-Lead     Disposition:   FU with Dorris Carnes, MD  Signed, Rosaria Ferries, PA-C  10/10/2018 1:18 PM    [place] Phone:  [phone]; Fax: [phone]  This note was written with the assistance of speech recognition software.  Please excuse any transcriptional errors.

## 2018-10-16 ENCOUNTER — Other Ambulatory Visit (HOSPITAL_COMMUNITY)
Admission: RE | Admit: 2018-10-16 | Discharge: 2018-10-16 | Disposition: A | Discharge status: Home / Self Care | Payer: Medicare Other | Source: Ambulatory Visit | Attending: Physician Assistant | Admitting: Physician Assistant

## 2018-10-16 DIAGNOSIS — I130 Hypertensive heart and chronic kidney disease with heart failure and stage 1 through stage 4 chronic kidney disease, or unspecified chronic kidney disease: Secondary | ICD-10-CM | POA: Diagnosis not present

## 2018-10-16 DIAGNOSIS — I2510 Atherosclerotic heart disease of native coronary artery without angina pectoris: Secondary | ICD-10-CM | POA: Diagnosis present

## 2018-10-16 DIAGNOSIS — Z79899 Other long term (current) drug therapy: Secondary | ICD-10-CM | POA: Diagnosis not present

## 2018-10-16 DIAGNOSIS — I480 Paroxysmal atrial fibrillation: Secondary | ICD-10-CM | POA: Insufficient documentation

## 2018-10-16 DIAGNOSIS — I5032 Chronic diastolic (congestive) heart failure: Secondary | ICD-10-CM | POA: Insufficient documentation

## 2018-10-16 DIAGNOSIS — K2210 Ulcer of esophagus without bleeding: Secondary | ICD-10-CM | POA: Insufficient documentation

## 2018-10-16 DIAGNOSIS — E785 Hyperlipidemia, unspecified: Secondary | ICD-10-CM | POA: Diagnosis not present

## 2018-10-16 DIAGNOSIS — N183 Chronic kidney disease, stage 3 (moderate): Secondary | ICD-10-CM | POA: Insufficient documentation

## 2018-10-16 DIAGNOSIS — Z7982 Long term (current) use of aspirin: Secondary | ICD-10-CM | POA: Insufficient documentation

## 2018-10-16 DIAGNOSIS — E1122 Type 2 diabetes mellitus with diabetic chronic kidney disease: Secondary | ICD-10-CM | POA: Diagnosis not present

## 2018-10-16 LAB — LIPID PANEL
Cholesterol: 143 mg/dL (ref 0–200)
HDL: 59 mg/dL (ref >40)
LDL CALC: 59 mg/dL (ref 0–99)
Total CHOL/HDL Ratio: 2.4 RATIO
Triglycerides: 125 mg/dL (ref <150)
VLDL: 25 mg/dL (ref 0–40)

## 2018-10-16 LAB — COMPREHENSIVE METABOLIC PANEL
ALT: 15 U/L (ref 0–44)
AST: 17 U/L (ref 15–41)
Albumin: 3.4 g/dL — ABNORMAL LOW (ref 3.5–5.0)
Alkaline Phosphatase: 55 U/L (ref 38–126)
Anion gap: 7 (ref 5–15)
BUN: 23 mg/dL (ref 8–23)
CHLORIDE: 110 mmol/L (ref 98–111)
CO2: 26 mmol/L (ref 22–32)
CREATININE: 1.63 mg/dL — AB (ref 0.44–1.00)
Calcium: 9.4 mg/dL (ref 8.9–10.3)
GFR calc Af Amer: 30 mL/min — ABNORMAL LOW (ref >60)
GFR calc non Af Amer: 26 mL/min — ABNORMAL LOW (ref >60)
GLUCOSE: 99 mg/dL (ref 70–99)
Potassium: 4.5 mmol/L (ref 3.5–5.1)
SODIUM: 143 mmol/L (ref 135–145)
Total Bilirubin: 0.6 mg/dL (ref 0.3–1.2)
Total Protein: 6.3 g/dL — ABNORMAL LOW (ref 6.5–8.1)

## 2018-10-16 LAB — CBC
HCT: 35.8 % — ABNORMAL LOW (ref 36.0–46.0)
HEMOGLOBIN: 10.8 g/dL — AB (ref 12.0–15.0)
MCH: 28.9 pg (ref 26.0–34.0)
MCHC: 30.2 g/dL (ref 30.0–36.0)
MCV: 95.7 fL (ref 80.0–100.0)
NRBC: 0 % (ref 0.0–0.2)
PLATELETS: 259 10*3/uL (ref 150–400)
RBC: 3.74 MIL/uL — AB (ref 3.87–5.11)
RDW: 13.2 % (ref 11.5–15.5)
WBC: 6.8 10*3/uL (ref 4.0–10.5)

## 2018-10-16 MED ORDER — POTASSIUM CHLORIDE CRYS ER 20 MEQ PO TBCR: EXTENDED_RELEASE_TABLET | ORAL | 11 refills | Status: DC

## 2018-10-16 NOTE — Addendum Note (Signed)
Addended by: Levonne Hubert on: 10/16/2018 10:56 AM   Modules accepted: Orders

## 2018-10-18 ENCOUNTER — Emergency Department (HOSPITAL_COMMUNITY)
Admission: EM | Admit: 2018-10-18 | Discharge: 2018-10-18 | Disposition: A | Discharge status: Home / Self Care | Payer: Medicare Other | Attending: Emergency Medicine | Admitting: Emergency Medicine

## 2018-10-18 ENCOUNTER — Emergency Department (HOSPITAL_COMMUNITY): Payer: Medicare Other

## 2018-10-18 ENCOUNTER — Encounter (HOSPITAL_COMMUNITY): Payer: Self-pay

## 2018-10-18 ENCOUNTER — Other Ambulatory Visit: Payer: Self-pay

## 2018-10-18 DIAGNOSIS — S62102A Fracture of unspecified carpal bone, left wrist, initial encounter for closed fracture: Secondary | ICD-10-CM

## 2018-10-18 DIAGNOSIS — I2510 Atherosclerotic heart disease of native coronary artery without angina pectoris: Secondary | ICD-10-CM | POA: Insufficient documentation

## 2018-10-18 DIAGNOSIS — I110 Hypertensive heart disease with heart failure: Secondary | ICD-10-CM | POA: Insufficient documentation

## 2018-10-18 DIAGNOSIS — Z951 Presence of aortocoronary bypass graft: Secondary | ICD-10-CM | POA: Diagnosis not present

## 2018-10-18 DIAGNOSIS — Z79899 Other long term (current) drug therapy: Secondary | ICD-10-CM | POA: Insufficient documentation

## 2018-10-18 DIAGNOSIS — Z7982 Long term (current) use of aspirin: Secondary | ICD-10-CM | POA: Insufficient documentation

## 2018-10-18 DIAGNOSIS — Y9389 Activity, other specified: Secondary | ICD-10-CM | POA: Diagnosis not present

## 2018-10-18 DIAGNOSIS — Y92002 Bathroom of unspecified non-institutional (private) residence single-family (private) house as the place of occurrence of the external cause: Secondary | ICD-10-CM | POA: Diagnosis not present

## 2018-10-18 DIAGNOSIS — S52532A Colles' fracture of left radius, initial encounter for closed fracture: Secondary | ICD-10-CM | POA: Diagnosis not present

## 2018-10-18 DIAGNOSIS — Z952 Presence of prosthetic heart valve: Secondary | ICD-10-CM | POA: Insufficient documentation

## 2018-10-18 DIAGNOSIS — S6992XA Unspecified injury of left wrist, hand and finger(s), initial encounter: Secondary | ICD-10-CM | POA: Diagnosis present

## 2018-10-18 DIAGNOSIS — S52502A Unspecified fracture of the lower end of left radius, initial encounter for closed fracture: Secondary | ICD-10-CM | POA: Diagnosis not present

## 2018-10-18 DIAGNOSIS — Y998 Other external cause status: Secondary | ICD-10-CM | POA: Insufficient documentation

## 2018-10-18 DIAGNOSIS — W01198A Fall on same level from slipping, tripping and stumbling with subsequent striking against other object, initial encounter: Secondary | ICD-10-CM | POA: Diagnosis not present

## 2018-10-18 DIAGNOSIS — I509 Heart failure, unspecified: Secondary | ICD-10-CM | POA: Insufficient documentation

## 2018-10-18 MED ORDER — LIDOCAINE-EPINEPHRINE (PF) 1 %-1:200000 IJ SOLN: 10.0000 mL | Freq: Once | INTRAMUSCULAR | Status: DC

## 2018-10-18 MED ORDER — LIDOCAINE-EPINEPHRINE (PF) 1 %-1:200000 IJ SOLN
INTRAMUSCULAR | Status: AC
  Filled 2018-10-18: qty 30

## 2018-10-18 MED ORDER — TRAMADOL HCL 50 MG PO TABS: 50.0000 mg | ORAL_TABLET | Freq: Two times a day (BID) | ORAL | 0 refills | Status: AC | PRN

## 2018-10-18 MED ORDER — TRAMADOL HCL 50 MG PO TABS: 50.0000 mg | ORAL_TABLET | Freq: Two times a day (BID) | ORAL | 0 refills | Status: DC | PRN

## 2018-10-18 NOTE — Discharge Instructions (Signed)
Follow-up with Dr. Aline Brochure on the 28th.  Take Tylenol as needed for the pain.  If this does not control the pain fill the prescription for the tramadol.

## 2018-10-18 NOTE — ED Provider Notes (Signed)
  Physical Exam  BP (!) 175/92 (BP Location: Right Arm)   Pulse 86   Temp 98 F (36.7 C) (Oral)   Resp 14   SpO2 96%   Physical Exam  ED Course/Procedures     Procedures  MDM  Seen by Dr. Aline Brochure and immobilized.  will follow-up on the 28th.  Discharge home.       Davonna Belling, MD 10/18/18 [phone]

## 2018-10-18 NOTE — ED Provider Notes (Signed)
[facility] EMERGENCY DEPARTMENT Provider Note   CSN: [id number] Arrival date & time: 10/18/18  1258     History   Chief Complaint Chief Complaint  Patient presents with  . Wrist Pain    HPI [name] is a 84 y.o. female.  HPI  The patient is a 84 year old female, she has a known history of hypercholesterolemia, she is on Lasix for peripheral edema, she has had prior heart surgery and bypass.  She is on Coreg and a baby aspirin.  She lives by herself, her daughter checks on her daily.  The report was that the patient had been ambulating in her bathroom, she reached for her deodorant and slipped falling to the ground, landing on her outstretched hands.  This caused acute onset of pain and swelling in her left wrist.  The daughter went to the pharmacy and got an Ace wrap to wrap it but because of the persistent pain she came to the emergency department.  The patient has no difficulty with ambulation, she is hard of hearing.  The symptoms are persistent and worse with movement, not associated with numbness.  The patient was in her usual state of health prior to this and denied any nausea vomiting diarrhea coughing shortness of breath chest pain headaches numbness or weakness.  Past Medical History:  Diagnosis Date  . Anemia   . Aortic stenosis   . CAD (coronary artery disease)   . CHF (congestive heart failure) ([place])   . GERD (gastroesophageal reflux disease)   . Hypertension     Patient Active Problem List   Diagnosis Date Noted  . Fracture, Colles, left, closed   . S/P AVR (aortic valve replacement) 07/31/2017  . Sinus tachycardia 11/01/2014  . UTI (urinary tract infection) 09/16/2014  . Dizziness 05/27/2012  . Hyperlipidemia 09/23/2011  . Atrial fibrillation ([place]) 09/23/2011  . Hypertension   . Anemia   . GERD (gastroesophageal reflux disease)   . CHF (congestive heart failure) ([place])   . CAD (coronary artery disease)   . Aortic stenosis     Past Surgical  History:  Procedure Laterality Date  . ABDOMINAL HYSTERECTOMY    . AORTIC VALVE REPLACEMENT  08/10/11  . CARDIAC SURGERY    . CATARACT EXTRACTION    . CORONARY ARTERY BYPASS GRAFT  08/19/22  . CYSTOSTOMY    . HERNIA REPAIR    . TONSILLECTOMY       OB History    Gravida  3   Para  3   Term  3   Preterm      AB      Living  3     SAB      TAB      Ectopic      Multiple      Live Births               Home Medications    Prior to Admission medications   Medication Sig Start Date End Date Taking? Authorizing Provider  acetaminophen (TYLENOL) 325 MG tablet Take 650 mg by mouth every 6 (six) hours as needed. For pain    Yes [provider]  aspirin EC 81 MG tablet Take 1 tablet (81 mg total) by mouth daily. 10/10/18  Yes Barrett, Evelene Croon, PA-C  carvedilol (COREG) 12.5 MG tablet TAKE 1 TABLET BY MOUTH TWICE DAILY 08/07/18  Yes Fay Records, MD  cetirizine (ZYRTEC) 10 MG tablet Take 10 mg by mouth daily.   Yes [provider]  docusate sodium (COLACE) 100 MG capsule Take 100 mg by mouth 2 (two) times daily.   Yes [provider]  furosemide (LASIX) 40 MG tablet TAKE 1 TABLET(40 MG TOTAL) BY MOUTH EVERY OTHER DAY Patient taking differently: TAKE 1 TABLET(40 MG TOTAL) BY MOUTH ON Monday AND Thursday WITH POTASSIUM. 12/05/17  Yes Imogene Burn, PA-C  omeprazole (PRILOSEC) 20 MG capsule take 1 capsule by mouth once daily 10/22/17  Yes Setzer, Terri L, NP  potassium chloride SA (K-DUR,KLOR-CON) 20 MEQ tablet Take 2 Times Weekly on Monday and Thursday. Take an extra Lasix and potassium tablet for weight gain of 3 lbs in a day or 5 lbs in a week 10/16/18  Yes Barrett, Rhonda G, PA-C  rosuvastatin (CRESTOR) 10 MG tablet TAKE 1 TABLET BY MOUTH ONCE DAILY 10/08/18  Yes Imogene Burn, PA-C  traMADol (ULTRAM) 50 MG tablet Take 1 tablet (50 mg total) by mouth every 12 (twelve) hours as needed. 10/18/18   Davonna Belling, MD    Family History Family  History  Problem Relation Age of Onset  . Heart failure Father   . Heart attack Father     Social History Social History   Tobacco Use  . Smoking status: Never Smoker  . Smokeless tobacco: Never Used  Substance Use Topics  . Alcohol use: No  . Drug use: No     Allergies   Fish allergy; Lipitor [atorvastatin calcium]; Cheese; Morphine and related; Penicillins; and Sulfa antibiotics   Review of Systems Review of Systems  All other systems reviewed and are negative.    Physical Exam Updated Vital Signs BP (!) 175/92 (BP Location: Right Arm)   Pulse 86   Temp 98 F (36.7 C) (Oral)   Resp 14   SpO2 96%   Physical Exam  Constitutional: She appears well-developed and well-nourished. No distress.  HENT:  Head: Normocephalic and atraumatic.  Mouth/Throat: Oropharynx is clear and moist. No oropharyngeal exudate.  Eyes: Pupils are equal, round, and reactive to light. Conjunctivae and EOM are normal. Right eye exhibits no discharge. Left eye exhibits no discharge. No scleral icterus.  Neck: Normal range of motion. Neck supple. No JVD present. No thyromegaly present.  Cardiovascular: Normal rate, regular rhythm and intact distal pulses. Exam reveals no gallop and no friction rub.  Murmur ( Systolic) heard. Pulmonary/Chest: Effort normal and breath sounds normal. No respiratory distress. She has no wheezes. She has no rales.  Abdominal: Soft. Bowel sounds are normal. She exhibits no distension and no mass. There is no tenderness.  Musculoskeletal: Normal range of motion. She exhibits edema ( Edema of the bilateral lower extremities), tenderness and deformity.  Mild deformity of the left wrist, there is what appears to be a Colles' deformity of the left distal forearm and wrist.  There is normal pulses at the radial artery.  She is able to make a grip and open her hand, she has normal sensation.  Lymphadenopathy:    She has no cervical adenopathy.  Neurological: She is alert.  Coordination normal.  Skin: Skin is warm and dry. No rash noted. No erythema.  Psychiatric: She has a normal mood and affect. Her behavior is normal.  Nursing note and vitals reviewed.    ED Treatments / Results  Labs (all labs ordered are listed, but only abnormal results are displayed) Labs Reviewed - No data to display  EKG None  Radiology Dg Wrist Complete Left  Result Date: 10/18/2018 CLINICAL DATA:  84 year old female  with a history of left wrist pain after a fall EXAM: LEFT WRIST - COMPLETE 3+ VIEW COMPARISON:  None. FINDINGS: Osteopenia. Distal radius fracture with approximately 40 degrees of volar angulation. Fracture line extends to the articular surface. Acute fracture of the ulnar styloid, minimally displaced. Irregularity in the mid pole of the scaphoid, as well as irregularity of the lunate with possible chip fracture on the lateral view. Degenerative changes of the first carpometacarpal joint Degenerative changes of the interphalangeal joints. IMPRESSION: Acute distal radius fracture with 40 degrees of volar angulation and extension of the fracture line to the articular surface. Minimally displaced fracture of the ulnar styloid. Osteopenia limits evaluation, though I suspect there is a scaphoid fracture and lunate fracture. Further evaluation with MRI recommended. Degenerative changes and osteopenia. Electronically Signed   By: Corrie Mckusick D.O.   On: 10/18/2018 14:17    Procedures Procedures (including critical care time)  Medications Ordered in ED Medications - No data to display   Initial Impression / Assessment and Plan / ED Course  I have reviewed the triage vital signs and the nursing notes.  Pertinent labs & imaging results that were available during my care of the patient were reviewed by me and considered in my medical decision making (see chart for details).    I do not see any signs of head injury, the patient has no complaints other than her left wrist  which appears to have a Colles' fracture.  Will obtain imaging, may need reduction, pain control, will do a hematoma block at minimum.  Discussed care with Dr. Aline Brochure - will see the patient as an outpatient to pursue fracture care - Dr. Aline Brochure saw the patient at the bedside and will follow up in clinic.  Final Clinical Impressions(s) / ED Diagnoses   Final diagnoses:  Closed fracture of left wrist, initial encounter    ED Discharge Orders         Ordered    traMADol (ULTRAM) 50 MG tablet  Every 12 hours PRN,   Status:  Discontinued     10/18/18 1713    For home use only DME standard manual wheelchair with seat cushion    Comments:  Patient suffers from wrist fracture which impairs their ability to perform daily activities like bathing in the home.  A walker will not resolve issue with performing activities of daily living. A wheelchair will allow patient to safely perform daily activities. Patient can safely propel the wheelchair in the home or has a caregiver who can provide assistance.  Accessories: elevating leg rests (ELRs), wheel locks, extensions and anti-tippers.   10/18/18 1737    traMADol (ULTRAM) 50 MG tablet  Every 12 hours PRN     10/18/18 1835           Noemi Chapel, MD 10/19/18 1157

## 2018-10-18 NOTE — Consult Note (Signed)
Patient ID: [name], female   DOB: 03/03/1922, 84 y.o.   MRN: [id number] ER CONSULT PATIENT WENT HOME  Detailed history Detailed exam Low complexity  Requested by Dr. Noemi Chapel, ER physician Reason for consultation fracture left wrist  Chief Complaint  Patient presents with  . Wrist Pain    HPI [name] is a 84 y.o. female.  This 84 year old female fell at home on October 19 injured her left wrist complained of severe pain complained of dull aching pain increased by movement and deformity of the wrist   Review of Systems (at least 2) ROS  1.  Denies shortness of breath denies neurologic symptoms 2.  she denies chest pain   Past Medical History:  Diagnosis Date  . Anemia   . Aortic stenosis   . CAD (coronary artery disease)   . CHF (congestive heart failure) ([place])   . GERD (gastroesophageal reflux disease)   . Hypertension      Past Surgical History:  Procedure Laterality Date  . ABDOMINAL HYSTERECTOMY    . AORTIC VALVE REPLACEMENT  08/10/11  . CARDIAC SURGERY    . CATARACT EXTRACTION    . CORONARY ARTERY BYPASS GRAFT  08/19/22  . CYSTOSTOMY    . HERNIA REPAIR    . TONSILLECTOMY       Social History  Social History   Tobacco Use  . Smoking status: Never Smoker  . Smokeless tobacco: Never Used  Substance Use Topics  . Alcohol use: No  . Drug use: No    Allergies  Allergen Reactions  . Fish Allergy Shortness Of Breath and Swelling  . Lipitor [Atorvastatin Calcium] Rash    Rash all over  . Cheese Other (See Comments)    Lips burn when she eats too much  . Morphine And Related Nausea And Vomiting  . Penicillins Other (See Comments)    unknown  . Sulfa Antibiotics Other (See Comments)    unknown    Current Facility-Administered Medications  Medication Dose Route Frequency Provider Last Rate Last Dose  . lidocaine-EPINEPHrine (XYLOCAINE-EPINEPHrine) 1 %-1:200000 (PF) injection 10 mL  10 mL Other Once Noemi Chapel, MD      .  lidocaine-EPINEPHrine Prudy Feeler) 1 %-1:200000 (PF) injection            Current Outpatient Medications  Medication Sig Dispense Refill  . acetaminophen (TYLENOL) 325 MG tablet Take 650 mg by mouth every 6 (six) hours as needed. For pain     . aspirin EC 81 MG tablet Take 1 tablet (81 mg total) by mouth daily. 90 tablet 3  . carvedilol (COREG) 12.5 MG tablet TAKE 1 TABLET BY MOUTH TWICE DAILY 180 tablet 3  . cetirizine (ZYRTEC) 10 MG tablet Take 10 mg by mouth daily.    [name] docusate sodium (COLACE) 100 MG capsule Take 100 mg by mouth 2 (two) times daily.    . furosemide (LASIX) 40 MG tablet TAKE 1 TABLET(40 MG TOTAL) BY MOUTH EVERY OTHER DAY (Patient taking differently: TAKE 1 TABLET(40 MG TOTAL) BY MOUTH ON Monday AND Thursday WITH POTASSIUM.) 45 tablet 6  . omeprazole (PRILOSEC) 20 MG capsule take 1 capsule by mouth once daily 30 capsule 0  . potassium chloride SA (K-DUR,KLOR-CON) 20 MEQ tablet Take 2 Times Weekly on Monday and Thursday. Take an extra Lasix and potassium tablet for weight gain of 3 lbs in a day or 5 lbs in a week 15 tablet 11  . rosuvastatin (CRESTOR) 10 MG tablet TAKE 1 TABLET  BY MOUTH ONCE DAILY 90 tablet 0      Physical Exam-Detailed Physical Exam  Blood pressure (!) 137/106, temperature 98 F (36.7 C), resp. rate 14, SpO2 97 %. Gen. appearance well-developed well-nourished no acute distress Cardiovascular exam the pulses are 2+ with no peripheral edema  The ambulatory status is walks without support  Extremity examined right upper extremity, wrist  Inspection Range of motion assessment full range of motion  Stability assessment stability test reveal no instability or laxity Muscle strength and muscle tone are normal with no atrophy or tremors Skin there are no scars rashes lesions or lacerations  Sensation to touch is normal The patient is oriented to person place and time The patient's mood and affect show no depression or anxiety or  agitation  The injured left upper extremity was deformed with dinner fork deformity of the wrist it was stable in terms of wrist joint stability muscle tone was normal the skin was severely thinned from age and very friable.  She had normal sensation she had decreased active range of motion but normal passive range of motion   MEDICAL DECISION MAKING (minimum/low)  Data Reviewed  I have personally reviewed the imaging studies and the report and my interpretation is:  Plain films show fracture of the distal radius with some shortening and 30degrees dorsal angulation of articular surface questionable scaphoid fracture  Assessment and Plan  Discussed with the patient and the person with her who was a family member or assistant and the patient gave consent for discussion of care.  We decided that based on her skin friability that closed reduction required too much force on the skin and that she did not want surgery so we splinted her as she was with a plan for cast in 10 days  Arther Abbott 10/18/2018, 4:20 PM   Carole Civil MD

## 2018-10-18 NOTE — ED Triage Notes (Signed)
Pt was reaching for deoderant today and lost her balance. Fell onto left wrist. In NAD. Alert and oriented. Hard of hearing.

## 2018-10-20 ENCOUNTER — Telehealth: Payer: Self-pay | Admitting: Orthopedic Surgery

## 2018-10-20 NOTE — Telephone Encounter (Signed)
Upon calling patient/family to schedule hospital follow up with Xrays out of plaster and cast for 10/27/18 per Dr Ruthe Mannan message, spoke with Bethena Roys, daughter, also patient's son and daughter in law, Richardson Landry and Santiago Glad - states since patient was discharged from [facility] emergency room, she has been unable to walk. States family is having a very difficult time with getting her to bathroom due to how her home is set up, said hallways and bathroom are not wheelchair accessible.  Said if she is to come to office, they will need to call EMS.  States they may need to have her admitted to rehab facility. States "we don't know what to do."  Please call [phone]  (I explained we still need to schedule appointment for next Monday - asked to hold until she is further advised.)

## 2018-10-20 NOTE — Telephone Encounter (Signed)
-----   Message from Carole Civil, MD sent at 10/18/2018  4:19 PM EDT ----- Need appt for mon 28th for xrays oop and cast

## 2018-10-20 NOTE — Telephone Encounter (Signed)
They need to call the primary care office  EMS will not transport here

## 2018-10-21 NOTE — Telephone Encounter (Signed)
ED is fine if that is what the family thinks she needs Dr Aline Brochure will see her for her wrist fracture ED note was sent to PCP and here, so the PCP should know there is a wrist fracture

## 2018-10-21 NOTE — Telephone Encounter (Signed)
Called back, relayed. Said that the Product/process development scientist did come out, and at this point there will be home health care, starting tomorrow. Aware of follow up appointment.

## 2018-10-21 NOTE — Telephone Encounter (Signed)
Called back to patient's daughter Bethena Roys, mainly spoke with daughter in law Santiago Glad who is the 24-hour care giver, states that she had contacted primary care at [facility], and the best they can suggest is for patient to come to their office for evaluation.  Again, family states they cannot transport her safely therefore would need to call EMS. Santiago Glad is asking if it is possible for Dr Aline Brochure to write a note or send notes with her orthopaedic problem to her PCP (or would this have been done by Emergency room?)  States they do have a home health person coming today, 10/21/18 at 10:00am.  States also that based on the multiple medical issues that have been occurring since she came home from emergency department, they may have to transport her back to the E.D. As for her follow up appointment, it is scheduled per Dr Ruthe Mannan message, for 10/27/18.

## 2018-10-21 NOTE — Care Management (Signed)
CM received call from [facility] requesting more documentation for wheelchair. ED documentation provided.

## 2018-10-23 ENCOUNTER — Other Ambulatory Visit: Payer: Self-pay

## 2018-10-23 ENCOUNTER — Observation Stay (HOSPITAL_COMMUNITY)
Admission: EM | Admit: 2018-10-23 | Discharge: 2018-10-24 | Disposition: A | Discharge status: Home / Self Care | Payer: Medicare Other | Attending: Family Medicine | Admitting: Family Medicine

## 2018-10-23 ENCOUNTER — Emergency Department (HOSPITAL_COMMUNITY): Payer: Medicare Other

## 2018-10-23 ENCOUNTER — Encounter (HOSPITAL_COMMUNITY): Payer: Self-pay

## 2018-10-23 DIAGNOSIS — Z9181 History of falling: Secondary | ICD-10-CM | POA: Diagnosis not present

## 2018-10-23 DIAGNOSIS — I2510 Atherosclerotic heart disease of native coronary artery without angina pectoris: Secondary | ICD-10-CM | POA: Diagnosis not present

## 2018-10-23 DIAGNOSIS — R531 Weakness: Principal | ICD-10-CM | POA: Insufficient documentation

## 2018-10-23 DIAGNOSIS — Z951 Presence of aortocoronary bypass graft: Secondary | ICD-10-CM | POA: Insufficient documentation

## 2018-10-23 DIAGNOSIS — I509 Heart failure, unspecified: Secondary | ICD-10-CM | POA: Diagnosis not present

## 2018-10-23 DIAGNOSIS — I110 Hypertensive heart disease with heart failure: Secondary | ICD-10-CM | POA: Diagnosis not present

## 2018-10-23 DIAGNOSIS — R296 Repeated falls: Secondary | ICD-10-CM

## 2018-10-23 DIAGNOSIS — I10 Essential (primary) hypertension: Secondary | ICD-10-CM | POA: Diagnosis present

## 2018-10-23 DIAGNOSIS — Z79899 Other long term (current) drug therapy: Secondary | ICD-10-CM | POA: Insufficient documentation

## 2018-10-23 LAB — COMPREHENSIVE METABOLIC PANEL
ALBUMIN: 3.3 g/dL — AB (ref 3.5–5.0)
ALK PHOS: 56 U/L (ref 38–126)
ALT: 11 U/L (ref 0–44)
AST: 11 U/L — AB (ref 15–41)
Anion gap: 7 (ref 5–15)
BUN: 28 mg/dL — AB (ref 8–23)
CHLORIDE: 107 mmol/L (ref 98–111)
CO2: 26 mmol/L (ref 22–32)
Calcium: 9.3 mg/dL (ref 8.9–10.3)
Creatinine, Ser: 1.75 mg/dL — ABNORMAL HIGH (ref 0.44–1.00)
GFR calc non Af Amer: 24 mL/min — ABNORMAL LOW (ref >60)
GFR, EST AFRICAN AMERICAN: 28 mL/min — AB (ref >60)
GLUCOSE: 105 mg/dL — AB (ref 70–99)
Potassium: 4.7 mmol/L (ref 3.5–5.1)
SODIUM: 140 mmol/L (ref 135–145)
Total Bilirubin: 0.6 mg/dL (ref 0.3–1.2)
Total Protein: 6.4 g/dL — ABNORMAL LOW (ref 6.5–8.1)

## 2018-10-23 LAB — CBC WITH DIFFERENTIAL/PLATELET
ABS IMMATURE GRANULOCYTES: 0.02 10*3/uL (ref 0.00–0.07)
Basophils Absolute: 0 10*3/uL (ref 0.0–0.1)
Basophils Relative: 0 %
Eosinophils Absolute: 0.2 10*3/uL (ref 0.0–0.5)
Eosinophils Relative: 3 %
HCT: 35.9 % — ABNORMAL LOW (ref 36.0–46.0)
Hemoglobin: 10.9 g/dL — ABNORMAL LOW (ref 12.0–15.0)
IMMATURE GRANULOCYTES: 0 %
LYMPHS PCT: 18 %
Lymphs Abs: 1.1 10*3/uL (ref 0.7–4.0)
MCH: 28.4 pg (ref 26.0–34.0)
MCHC: 30.4 g/dL (ref 30.0–36.0)
MCV: 93.5 fL (ref 80.0–100.0)
MONO ABS: 0.4 10*3/uL (ref 0.1–1.0)
Monocytes Relative: 7 %
NEUTROS ABS: 4.5 10*3/uL (ref 1.7–7.7)
Neutrophils Relative %: 72 %
Platelets: 294 10*3/uL (ref 150–400)
RBC: 3.84 MIL/uL — AB (ref 3.87–5.11)
RDW: 13.2 % (ref 11.5–15.5)
WBC: 6.3 10*3/uL (ref 4.0–10.5)
nRBC: 0 % (ref 0.0–0.2)

## 2018-10-23 LAB — URINALYSIS, ROUTINE W REFLEX MICROSCOPIC
BILIRUBIN URINE: NEGATIVE
GLUCOSE, UA: NEGATIVE mg/dL
HGB URINE DIPSTICK: NEGATIVE
KETONES UR: NEGATIVE mg/dL
Nitrite: POSITIVE — AB
PH: 7 (ref 5.0–8.0)
PROTEIN: NEGATIVE mg/dL
Specific Gravity, Urine: 1.006 (ref 1.005–1.030)

## 2018-10-23 LAB — TSH: TSH: 1.482 u[IU]/mL (ref 0.350–4.500)

## 2018-10-23 LAB — I-STAT CG4 LACTIC ACID, ED: Lactic Acid, Venous: 0.96 mmol/L (ref 0.5–1.9)

## 2018-10-23 MED ORDER — CARVEDILOL 12.5 MG PO TABS
12.5000 mg | ORAL_TABLET | Freq: Two times a day (BID) | ORAL | Status: DC
  Administered 2018-10-23 – 2018-10-24 (×2): 12.5 mg via ORAL
  Filled 2018-10-23: qty 1

## 2018-10-23 MED ORDER — MINERAL OIL RE ENEM: 1.0000 | ENEMA | Freq: Once | RECTAL | Status: DC

## 2018-10-23 MED ORDER — ACETAMINOPHEN 650 MG RE SUPP: 650.0000 mg | Freq: Four times a day (QID) | RECTAL | Status: DC | PRN

## 2018-10-23 MED ORDER — ONDANSETRON HCL 4 MG/2ML IJ SOLN: 4.0000 mg | Freq: Four times a day (QID) | INTRAMUSCULAR | Status: DC | PRN

## 2018-10-23 MED ORDER — FUROSEMIDE 40 MG PO TABS: 40.0000 mg | ORAL_TABLET | ORAL | Status: DC

## 2018-10-23 MED ORDER — ROSUVASTATIN CALCIUM 10 MG PO TABS: 10.0000 mg | ORAL_TABLET | Freq: Every evening | ORAL | Status: DC

## 2018-10-23 MED ORDER — ENOXAPARIN SODIUM 30 MG/0.3ML ~~LOC~~ SOLN: 30.0000 mg | SUBCUTANEOUS | Status: DC

## 2018-10-23 MED ORDER — POTASSIUM CHLORIDE CRYS ER 20 MEQ PO TBCR: 20.0000 meq | EXTENDED_RELEASE_TABLET | ORAL | Status: DC

## 2018-10-23 MED ORDER — ASPIRIN EC 81 MG PO TBEC
81.0000 mg | DELAYED_RELEASE_TABLET | Freq: Every day | ORAL | Status: DC
  Administered 2018-10-24: 81 mg via ORAL
  Filled 2018-10-23: qty 1

## 2018-10-23 MED ORDER — ACETAMINOPHEN 325 MG PO TABS
650.0000 mg | ORAL_TABLET | Freq: Four times a day (QID) | ORAL | Status: DC | PRN
  Administered 2018-10-24: 650 mg via ORAL
  Filled 2018-10-23: qty 2

## 2018-10-23 MED ORDER — LORATADINE 10 MG PO TABS
10.0000 mg | ORAL_TABLET | Freq: Every day | ORAL | Status: DC
  Administered 2018-10-24: 10 mg via ORAL
  Filled 2018-10-23: qty 1

## 2018-10-23 MED ORDER — TRAMADOL HCL 50 MG PO TABS: 50.0000 mg | ORAL_TABLET | Freq: Two times a day (BID) | ORAL | Status: DC | PRN

## 2018-10-23 MED ORDER — SENNOSIDES-DOCUSATE SODIUM 8.6-50 MG PO TABS
2.0000 | ORAL_TABLET | Freq: Two times a day (BID) | ORAL | Status: DC
  Administered 2018-10-24: 2 via ORAL
  Filled 2018-10-23: qty 2

## 2018-10-23 MED ORDER — ONDANSETRON HCL 4 MG PO TABS: 4.0000 mg | ORAL_TABLET | Freq: Four times a day (QID) | ORAL | Status: DC | PRN

## 2018-10-23 MED ORDER — PANTOPRAZOLE SODIUM 40 MG PO TBEC
40.0000 mg | DELAYED_RELEASE_TABLET | Freq: Every day | ORAL | Status: DC
  Administered 2018-10-24: 40 mg via ORAL
  Filled 2018-10-23: qty 1

## 2018-10-23 MED ORDER — POLYETHYLENE GLYCOL 3350 17 G PO PACK
17.0000 g | PACK | Freq: Two times a day (BID) | ORAL | Status: DC
  Administered 2018-10-24: 17 g via ORAL
  Filled 2018-10-23: qty 1

## 2018-10-23 NOTE — ED Triage Notes (Signed)
Pt seen in ED on 10/18/18 due to fall. Cast to left forearm. Family reports pt has been weaker and not walking as well. Also urine noted to be cloudy per family

## 2018-10-23 NOTE — H&P (Signed)
History and Physical    KIMELA MALSTROM PJA:[id number] DOB: 1922/10/13 DOA: 10/23/2018  PCP: Jake Samples, PA-C   Patient coming from: Home  Chief Complaint: Falls, Generalized weakness.  HPI: [name] is a 84 y.o. female with medical history significant for HTN, CHF, CAD, Aortic valve repair, AF, who presented to the ED with reports of increasing generalized weakness since recent fall for which she was seen in the ED. Patient has a hx of multiple falls, she fell 10/19, and sustained a wrist fracture. She was seen by orthopedic surgeon Dr. Aline Brochure, the plan was for a splint and for the patent to follow up as an outpatient for a cast 10 days later- 10/27/18. Initially patient was able to ambulate without needing assistance, but over the next few days, she required assistance with ambulation, and then couldn't stand without assistance- as her legs would give out, then she couldn't get up from the bed and family couldn't help. Patient has fallen again since her wrist fracture.    Patient is hard to understand 2/2 slurred speech from cleft palate, but she is mentally at her baseline, awake and alert, denies chest pain, dizziness, denies dysuria. Chronic SOB which family feels is getting worse over the past year, but no acute change. Chronic unchanged productive cough. Chronic unchanged lower extremity swelling. No fevers or chills. She denies pain anywhere even in fractured wrist. Denies extremity weakness. No she has maintained good PO intake but has not had  Bowel movement in at least 5 days. She is on tramadol for pain. Reports compliance with her lasix.   ED Course: Mildly low temp- 34.1, systolic 937T- 024O. WBC- 6.3. Cr at baseline 1.75. Stable Hgb 10.9. Lactic acid- 0.96.  Head CT- Negative for acute abnormality. Chest Xray- Small bilat pleural effusion, basilar atelectasis or infiltrates. EKG- Old RBBB. Blood cultures drawn.   Review of Systems: As per HPI all other systems  reviewed and negative.  Past Medical History:  Diagnosis Date  . Anemia   . Aortic stenosis   . CAD (coronary artery disease)   . CHF (congestive heart failure) ([place])   . GERD (gastroesophageal reflux disease)   . Hypertension     Past Surgical History:  Procedure Laterality Date  . ABDOMINAL HYSTERECTOMY    . AORTIC VALVE REPLACEMENT  08/10/11  . CARDIAC SURGERY    . CATARACT EXTRACTION    . CORONARY ARTERY BYPASS GRAFT  08/19/22  . CYSTOSTOMY    . HERNIA REPAIR    . TONSILLECTOMY       reports that she has never smoked. She has never used smokeless tobacco. She reports that she does not drink alcohol or use drugs.  Allergies  Allergen Reactions  . Fish Allergy Shortness Of Breath and Swelling  . Lipitor [Atorvastatin Calcium] Rash    Rash all over  . Cheese Other (See Comments)    Lips burn when she eats too much  . Morphine And Related Nausea And Vomiting  . Penicillins Other (See Comments)    unknown  . Sulfa Antibiotics Other (See Comments)    unknown    Family History  Problem Relation Age of Onset  . Heart failure Father   . Heart attack Father     Prior to Admission medications   Medication Sig Start Date End Date Taking? Authorizing Provider  acetaminophen (TYLENOL) 325 MG tablet Take 650 mg by mouth every 6 (six) hours as needed. For pain    Yes [provider]  aspirin EC 81 MG tablet Take 1 tablet (81 mg total) by mouth daily. 10/10/18  Yes Barrett, Evelene Croon, PA-C  carvedilol (COREG) 12.5 MG tablet TAKE 1 TABLET BY MOUTH TWICE DAILY Patient taking differently: Take 12.5 mg by mouth 2 (two) times daily with a meal.  08/07/18  Yes Fay Records, MD  cetirizine (ZYRTEC) 10 MG tablet Take 10 mg by mouth every morning.    Yes [provider]  docusate sodium (COLACE) 100 MG capsule Take 100 mg by mouth 3 (three) times daily.    Yes [provider]  furosemide (LASIX) 40 MG tablet TAKE 1 TABLET(40 MG TOTAL) BY MOUTH EVERY OTHER  DAY Patient taking differently: TAKE 1 TABLET(40 MG TOTAL) BY MOUTH ON Monday AND Thursday WITH POTASSIUM. 12/05/17  Yes Imogene Burn, PA-C  omeprazole (PRILOSEC) 20 MG capsule take 1 capsule by mouth once daily Patient taking differently: Take 20 mg by mouth every morning.  10/22/17  Yes Setzer, Rona Ravens, NP  potassium chloride SA (K-DUR,KLOR-CON) 20 MEQ tablet Take 2 Times Weekly on Monday and Thursday. Take an extra Lasix and potassium tablet for weight gain of 3 lbs in a day or 5 lbs in a week Patient taking differently: Take 20 mEq by mouth 2 (two) times a week. Take 2 Times Weekly on Monday and Thursday. Take an extra Lasix and potassium tablet for weight gain of 3 lbs in a day or 5 lbs in a week 10/16/18  Yes Barrett, Rhonda G, PA-C  rosuvastatin (CRESTOR) 10 MG tablet TAKE 1 TABLET BY MOUTH ONCE DAILY Patient taking differently: Take 10 mg by mouth every evening.  10/08/18  Yes Imogene Burn, PA-C  traMADol (ULTRAM) 50 MG tablet Take 1 tablet (50 mg total) by mouth every 12 (twelve) hours as needed. Patient taking differently: Take 50 mg by mouth every 12 (twelve) hours as needed for moderate pain.  10/18/18  Yes Davonna Belling, MD    Physical Exam: Vitals:   10/23/18 1327 10/23/18 1329 10/23/18 1400 10/23/18 1407  BP:  (!) 160/92 140/88   Pulse:  91 78   Resp:  16 14   Temp:  (!) 97.5 F (36.4 C)  (!) 97.4 F (36.3 C)  TempSrc:  Oral  Rectal  SpO2:  98% 96%   Weight: 56.7 kg       Constitutional: NAD, calm, comfortable Vitals:   10/23/18 1327 10/23/18 1329 10/23/18 1400 10/23/18 1407  BP:  (!) 160/92 140/88   Pulse:  91 78   Resp:  16 14   Temp:  (!) 97.5 F (36.4 C)  (!) 97.4 F (36.3 C)  TempSrc:  Oral  Rectal  SpO2:  98% 96%   Weight: 56.7 kg      Eyes: PERRL, lids and conjunctivae normal ENMT: Mucous membranes are moist. Posterior pharynx clear of any exudate or lesions. Neck: normal, supple, no masses, no thyromegaly Respiratory: clear to auscultation  bilaterally, no wheezing, no crackles. Normal respiratory effort. No accessory muscle use.  Cardiovascular: Regular rate and rhythm, no murmurs / rubs / gallops. Trace bilat pitting extremity edema. 2+ pedal pulses. Abdomen: Full without no tenderness, no masses palpated. No hepatosplenomegaly. Bowel sounds positive.  Musculoskeletal: no clubbing / cyanosis. No joint deformity upper and lower extremities. Good ROM, no contractures. Normal muscle tone.  Skin:  Bruising on bilat lower extremities, senile purpura, no ulcers. No induration Neurologic: CN 2-12 grossly intact. Strength 5/5 in all 4.  Psychiatric: Normal judgment  and insight. Alert and oriented x 3. Normal mood.   Labs on Admission: I have personally reviewed following labs and imaging studies  CBC: Recent Labs  Lab 10/23/18 1436  WBC 6.3  NEUTROABS 4.5  HGB 10.9*  HCT 35.9*  MCV 93.5  PLT 638   Basic Metabolic Panel: Recent Labs  Lab 10/23/18 1436  NA 140  K 4.7  CL 107  CO2 26  GLUCOSE 105*  BUN 28*  CREATININE 1.75*  CALCIUM 9.3   Liver Function Tests: Recent Labs  Lab 10/23/18 1436  AST 11*  ALT 11  ALKPHOS 56  BILITOT 0.6  PROT 6.4*  ALBUMIN 3.3*    Radiological Exams on Admission: Dg Chest 2 View  Result Date: 10/23/2018 CLINICAL DATA:  Weakness EXAM: CHEST - 2 VIEW COMPARISON:  09/11/2011 FINDINGS: Post sternotomy changes with valve prosthesis. Small pleural effusions. Mild cardiomegaly. Mild bibasilar atelectasis or infiltrates. Aortic atherosclerosis. Retrocardiac lucency may reflect hiatal hernia although not well localized on lateral view. The distal thoracic aorta appears tortuous. No pneumothorax. IMPRESSION: 1. Small bilateral pleural effusions with basilar atelectasis or infiltrates. Mild cardiomegaly. 2. Suspected retrocardiac lucency as may be seen with hiatal hernia. Electronically Signed   By: Donavan Foil M.D.   On: 10/23/2018 16:11   Ct Head Wo Contrast  Result Date:  10/23/2018 CLINICAL DATA:  Fall, head trauma EXAM: CT HEAD WITHOUT CONTRAST TECHNIQUE: Contiguous axial images were obtained from the base of the skull through the vertex without intravenous contrast. COMPARISON:  CT 08/20/2006 FINDINGS: Brain: No acute territorial infarction, hemorrhage or intracranial mass. Atrophy and extensive small vessel ischemic changes of the white matter. Enlarged ventricles felt secondary to atrophy. Vascular: No hyperdense vessels.  Carotid vascular calcification Skull: Normal. Negative for fracture or focal lesion. Sinuses/Orbits: No acute finding. Other: None IMPRESSION: 1. No CT evidence for acute intracranial abnormality. 2. Atrophy with small vessel ischemic changes of the white matter. Electronically Signed   By: Donavan Foil M.D.   On: 10/23/2018 16:08    EKG: Independently reviewed. Sinus rhythm. Old RBBB.  Assessment/Plan Principal Problem:   Falls frequently Active Problems:   Hypertension   CHF (congestive heart failure) (H[facility])  Frequent falls- with wrist fracture, has a temporary splint. Plans for cast 10/28. Falls likely 2/2 increased frailty, advanced age. Head Ct- negative. No change in cardio-respiratory status. Chest xray- Small pleural effusions. UA- pending. Mildly hypothermic. Increasing generalized weakness needing more assistance with ADLS.  - PT eval - Social work consult - TSH  Constipation- no bowel movement in at least 5 days. On tramadol.  - Enema and stool softners.  CHF- appears stable. LAst Echo 2016- Ef- 50-55%, G1DD. Weight over the past year unchanged per chart review. - Cont home lasix, K supplimentation, coreg  CKD4- Cr at baseline 1.75.  - Cont lasix.  CAD, Aortic valve repair, Atria fibrillation Hx- Stable. No chest pain.  In sinus. Not on anticoagulation. Patient had LIMA-LAD with AVR in 8/12.  - Cont aspirin, statin.  HTN- Stable to elevated. - continue coreg and lasix.   DVT prophylaxis: Lovenox Code Status:  Full Family Communication: Son and Daughter in Engineer, agricultural at bedside. Disposition Plan: Per rounding team Consults called: None Admission status: Obs, med-surg   Bethena Roys MD [facility] Pager [phone] From 3PM- 11PM.  Otherwise please contact night-coverage [email or web address] Password [facility]  10/23/2018, 6:04 PM

## 2018-10-23 NOTE — ED Provider Notes (Signed)
[facility] EMERGENCY DEPARTMENT Provider Note   CSN: [id number] Arrival date & time: 10/23/18  1323     History   Chief Complaint Chief Complaint  Patient presents with  . Weakness    HPI [name] is a 84 y.o. female.  History is obtained from patient's daughter-in-law and son, and from patient  HPI Patient has been generally weak since falling on 10/18/2018 resulting in a fracture of her left wrist.  Son and daughter-in-law state that normally she can walk a fair distance with her walker.  For the past few days she has been unable to stand without assistance of 1 or 2 people.  Home health has been involved in her care delivering a bedside commode.  She has a [employer] fast streamer and hospital bed to be delivered tomorrow.  However daughter-in-law states that she is unable to care for her as presently she is even too weak to stand to get to the bedside commode.  She is had one fall since her last ED visit however no injury resulted.  No treatment prior to coming here.  Patient denies pain anywhere. Past Medical History:  Diagnosis Date  . Anemia   . Aortic stenosis   . CAD (coronary artery disease)   . CHF (congestive heart failure) ([place])   . GERD (gastroesophageal reflux disease)   . Hypertension     Patient Active Problem List   Diagnosis Date Noted  . Fracture, [place], left, closed   . S/P AVR (aortic valve replacement) 07/31/2017  . Sinus tachycardia 11/01/2014  . UTI (urinary tract infection) 09/16/2014  . Dizziness 05/27/2012  . Hyperlipidemia 09/23/2011  . Atrial fibrillation ([place]) 09/23/2011  . Hypertension   . Anemia   . GERD (gastroesophageal reflux disease)   . CHF (congestive heart failure) ([place])   . CAD (coronary artery disease)   . Aortic stenosis     Past Surgical History:  Procedure Laterality Date  . ABDOMINAL HYSTERECTOMY    . AORTIC VALVE REPLACEMENT  08/10/11  . CARDIAC SURGERY    . CATARACT EXTRACTION    . CORONARY ARTERY BYPASS GRAFT  08/19/22   . CYSTOSTOMY    . HERNIA REPAIR    . TONSILLECTOMY       OB History    Gravida  3   Para  3   Term  3   Preterm      AB      Living  3     SAB      TAB      Ectopic      Multiple      Live Births               Home Medications    Prior to Admission medications   Medication Sig Start Date End Date Taking? Authorizing Provider  acetaminophen (TYLENOL) 325 MG tablet Take 650 mg by mouth every 6 (six) hours as needed. For pain     [provider]  aspirin EC 81 MG tablet Take 1 tablet (81 mg total) by mouth daily. 10/10/18   Barrett, Evelene Croon, PA-C  carvedilol (COREG) 12.5 MG tablet TAKE 1 TABLET BY MOUTH TWICE DAILY 08/07/18   Fay Records, MD  cetirizine (ZYRTEC) 10 MG tablet Take 10 mg by mouth daily.    [provider]  docusate sodium (COLACE) 100 MG capsule Take 100 mg by mouth 2 (two) times daily.    [provider]  furosemide (LASIX) 40 MG tablet TAKE  1 TABLET(40 MG TOTAL) BY MOUTH EVERY OTHER DAY Patient taking differently: TAKE 1 TABLET(40 MG TOTAL) BY MOUTH ON Monday AND Thursday WITH POTASSIUM. 12/05/17   Imogene Burn, PA-C  omeprazole (PRILOSEC) 20 MG capsule take 1 capsule by mouth once daily 10/22/17   Vaughan Basta, Rona Ravens, NP  potassium chloride SA (K-DUR,KLOR-CON) 20 MEQ tablet Take 2 Times Weekly on Monday and Thursday. Take an extra Lasix and potassium tablet for weight gain of 3 lbs in a day or 5 lbs in a week 10/16/18   Barrett, Evelene Croon, PA-C  rosuvastatin (CRESTOR) 10 MG tablet TAKE 1 TABLET BY MOUTH ONCE DAILY 10/08/18   Imogene Burn, PA-C  traMADol (ULTRAM) 50 MG tablet Take 1 tablet (50 mg total) by mouth every 12 (twelve) hours as needed. 10/18/18   Davonna Belling, MD    Family History Family History  Problem Relation Age of Onset  . Heart failure Father   . Heart attack Father     Social History Social History   Tobacco Use  . Smoking status: Never Smoker  . Smokeless tobacco: Never Used    Substance Use Topics  . Alcohol use: No  . Drug use: No     Allergies   Fish allergy; Lipitor [atorvastatin calcium]; Cheese; Morphine and related; Penicillins; and Sulfa antibiotics   Review of Systems Review of Systems  HENT: Positive for hearing loss.        Chronically hard of hearing  Musculoskeletal: Positive for gait problem.       Walks with walker at baseline  Neurological: Positive for speech difficulty and weakness.       Chronic speech difficulty due to cleft palate.  Generalized weakness  All other systems reviewed and are negative.    Physical Exam Updated Vital Signs BP (!) 160/92 (BP Location: Right Arm)   Pulse 91   Temp (!) 97.4 F (36.3 C) (Rectal)   Resp 16   Wt 56.7 kg   SpO2 98%   BMI 21.46 kg/m   Physical Exam  Constitutional:  Frail-appearing  HENT:  Head: Normocephalic and atraumatic.  Eyes: Pupils are equal, round, and reactive to light. Conjunctivae are normal.  Neck: Neck supple. No tracheal deviation present. No thyromegaly present.  Cardiovascular: Normal rate and regular rhythm.  No murmur heard. Pulmonary/Chest: Effort normal and breath sounds normal.  Abdominal: Soft. Bowel sounds are normal. She exhibits no distension. There is no tenderness.  Musculoskeletal: Normal range of motion. She exhibits no edema or tenderness.  Entire spine nontender.  Pelvis stable nontender.  Left upper extremity long-arm splint.  Fingers with good capillary refill.  Neurological: She is alert. No cranial nerve deficit. She exhibits normal muscle tone. Coordination normal.  Speech is garbled which family member say is her normal speech.  Follows simple commands, moves all extremities cranial nerves II through XII grossly intact  Skin: Skin is warm and dry. Capillary refill takes less than 2 seconds. No rash noted.  Ecchymotic at shins bilaterally otherwise without rash  Psychiatric: She has a normal mood and affect.  Nursing note and vitals  reviewed.    ED Treatments / Results  Labs (all labs ordered are listed, but only abnormal results are displayed) Labs Reviewed  CULTURE, BLOOD (ROUTINE X 2)  CULTURE, BLOOD (ROUTINE X 2)  COMPREHENSIVE METABOLIC PANEL  CBC WITH DIFFERENTIAL/PLATELET  URINALYSIS, ROUTINE W REFLEX MICROSCOPIC  I-STAT CG4 LACTIC ACID, ED    EKG EKG Interpretation  Date/Time:  Thursday October 23 2018 14:18:56 EDT Ventricular Rate:  76 PR Interval:    QRS Duration: 131 QT Interval:  441 QTC Calculation: 496 R Axis:   -22 Text Interpretation:  Sinus rhythm Right bundle branch block Inferior infarct, old No significant change since last tracing Confirmed by Orlie Dakin [phone]) on 10/23/2018 2:20:48 PM   Radiology No results found.  Procedures Procedures (including critical care time)  Medications Ordered in ED Medications - No data to display   Initial Impression / Assessment and Plan / ED Course  I have reviewed the triage vital signs and the nursing notes.  Pertinent labs & imaging results that were available during my care of the patient were reviewed by me and considered in my medical decision making (see chart for details).     \ Results for orders placed or performed during the hospital encounter of 10/23/18  Comprehensive metabolic panel  Result Value Ref Range   Sodium 140 135 - 145 mmol/L   Potassium 4.7 3.5 - 5.1 mmol/L   Chloride 107 98 - 111 mmol/L   CO2 26 22 - 32 mmol/L   Glucose, Bld 105 (H) 70 - 99 mg/dL   BUN 28 (H) 8 - 23 mg/dL   Creatinine, Ser 1.75 (H) 0.44 - 1.00 mg/dL   Calcium 9.3 8.9 - 10.3 mg/dL   Total Protein 6.4 (L) 6.5 - 8.1 g/dL   Albumin 3.3 (L) 3.5 - 5.0 g/dL   AST 11 (L) 15 - 41 U/L   ALT 11 0 - 44 U/L   Alkaline Phosphatase 56 38 - 126 U/L   Total Bilirubin 0.6 0.3 - 1.2 mg/dL   GFR calc non Af Amer 24 (L) >60 mL/min   GFR calc Af Amer 28 (L) >60 mL/min   Anion gap 7 5 - 15  CBC with Differential/Platelet  Result Value Ref Range    WBC 6.3 4.0 - 10.5 K/uL   RBC 3.84 (L) 3.87 - 5.11 MIL/uL   Hemoglobin 10.9 (L) 12.0 - 15.0 g/dL   HCT 35.9 (L) 36.0 - 46.0 %   MCV 93.5 80.0 - 100.0 fL   MCH 28.4 26.0 - 34.0 pg   MCHC 30.4 30.0 - 36.0 g/dL   RDW 13.2 11.5 - 15.5 %   Platelets 294 150 - 400 K/uL   nRBC 0.0 0.0 - 0.2 %   Neutrophils Relative % 72 %   Neutro Abs 4.5 1.7 - 7.7 K/uL   Lymphocytes Relative 18 %   Lymphs Abs 1.1 0.7 - 4.0 K/uL   Monocytes Relative 7 %   Monocytes Absolute 0.4 0.1 - 1.0 K/uL   Eosinophils Relative 3 %   Eosinophils Absolute 0.2 0.0 - 0.5 K/uL   Basophils Relative 0 %   Basophils Absolute 0.0 0.0 - 0.1 K/uL   Immature Granulocytes 0 %   Abs Immature Granulocytes 0.02 0.00 - 0.07 K/uL  TSH  Result Value Ref Range   TSH 1.482 0.350 - 4.500 uIU/mL  I-Stat CG4 Lactic Acid, ED  Result Value Ref Range   Lactic Acid, Venous 0.96 0.5 - 1.9 mmol/L  Chest x-ray viewed by me Dg Chest 2 View  Result Date: 10/23/2018 CLINICAL DATA:  Weakness EXAM: CHEST - 2 VIEW COMPARISON:  09/11/2011 FINDINGS: Post sternotomy changes with valve prosthesis. Small pleural effusions. Mild cardiomegaly. Mild bibasilar atelectasis or infiltrates. Aortic atherosclerosis. Retrocardiac lucency may reflect hiatal hernia although not well localized on lateral view. The distal thoracic aorta appears tortuous. No pneumothorax. IMPRESSION: 1. Small bilateral  pleural effusions with basilar atelectasis or infiltrates. Mild cardiomegaly. 2. Suspected retrocardiac lucency as may be seen with hiatal hernia. Electronically Signed   By: Donavan Foil M.D.   On: 10/23/2018 16:11   Dg Wrist Complete Left  Result Date: 10/18/2018 CLINICAL DATA:  84 year old female with a history of left wrist pain after a fall EXAM: LEFT WRIST - COMPLETE 3+ VIEW COMPARISON:  None. FINDINGS: Osteopenia. Distal radius fracture with approximately 40 degrees of volar angulation. Fracture line extends to the articular surface. Acute fracture of the ulnar  styloid, minimally displaced. Irregularity in the mid pole of the scaphoid, as well as irregularity of the lunate with possible chip fracture on the lateral view. Degenerative changes of the first carpometacarpal joint Degenerative changes of the interphalangeal joints. IMPRESSION: Acute distal radius fracture with 40 degrees of volar angulation and extension of the fracture line to the articular surface. Minimally displaced fracture of the ulnar styloid. Osteopenia limits evaluation, though I suspect there is a scaphoid fracture and lunate fracture. Further evaluation with MRI recommended. Degenerative changes and osteopenia. Electronically Signed   By: Corrie Mckusick D.O.   On: 10/18/2018 14:17   Ct Head Wo Contrast  Result Date: 10/23/2018 CLINICAL DATA:  Fall, head trauma EXAM: CT HEAD WITHOUT CONTRAST TECHNIQUE: Contiguous axial images were obtained from the base of the skull through the vertex without intravenous contrast. COMPARISON:  CT 08/20/2006 FINDINGS: Brain: No acute territorial infarction, hemorrhage or intracranial mass. Atrophy and extensive small vessel ischemic changes of the white matter. Enlarged ventricles felt secondary to atrophy. Vascular: No hyperdense vessels.  Carotid vascular calcification Skull: Normal. Negative for fracture or focal lesion. Sinuses/Orbits: No acute finding. Other: None IMPRESSION: 1. No CT evidence for acute intracranial abnormality. 2. Atrophy with small vessel ischemic changes of the white matter. Electronically Signed   By: Donavan Foil M.D.   On: 10/23/2018 16:08   Lab work remarkable for renal insufficiency which is chronic and anemia which is chronic. Chest x-ray viewed by me.  Favor mild chronic CHF rather than pneumonia.  Patient has history of CHF no leukocytosis, no fever.  Blood cultures obtained.  BNP would likely be worthless in light of patient's renal insufficiency   I consulted Dr.EMOKPOE for overnight stay.  Patient will have additional home  health needs such as hospital bed and Hoyer lift delivered to her house tomorrow morning Final Clinical Impressions(s) / ED Diagnoses  Diagnoses #1 generalized weakness #2 multiple falls #3 chronic renal insufficiency #4 anemia Final diagnoses:  None    ED Discharge Orders    None       Orlie Dakin, MD 10/23/18 1821

## 2018-10-24 DIAGNOSIS — I5032 Chronic diastolic (congestive) heart failure: Secondary | ICD-10-CM | POA: Diagnosis not present

## 2018-10-24 DIAGNOSIS — I10 Essential (primary) hypertension: Secondary | ICD-10-CM

## 2018-10-24 DIAGNOSIS — R296 Repeated falls: Secondary | ICD-10-CM

## 2018-10-24 MED ORDER — FUROSEMIDE 40 MG PO TABS: 40.0000 mg | ORAL_TABLET | ORAL | Status: DC

## 2018-10-24 MED ORDER — FUROSEMIDE 40 MG PO TABS: ORAL_TABLET | ORAL | 6 refills | Status: AC

## 2018-10-24 MED ORDER — AMLODIPINE BESYLATE 5 MG PO TABS: 5.0000 mg | ORAL_TABLET | Freq: Every day | ORAL | 0 refills | Status: DC

## 2018-10-24 MED ORDER — POTASSIUM CHLORIDE CRYS ER 20 MEQ PO TBCR: 20.0000 meq | EXTENDED_RELEASE_TABLET | ORAL | Status: AC

## 2018-10-24 MED ORDER — AMLODIPINE BESYLATE 5 MG PO TABS
5.0000 mg | ORAL_TABLET | Freq: Every day | ORAL | Status: DC
  Administered 2018-10-24: 5 mg via ORAL
  Filled 2018-10-24: qty 1

## 2018-10-24 MED ORDER — POLYETHYLENE GLYCOL 3350 17 G PO PACK: 17.0000 g | PACK | Freq: Every day | ORAL | 0 refills | Status: AC

## 2018-10-24 NOTE — Evaluation (Signed)
Physical Therapy Evaluation Patient Details Name: [name] MRN: [id number] DOB: December 15, 1922 Today's Date: 10/24/2018   History of Present Illness  [name] is a 84 y.o. female with medical history significant for HTN, CHF, CAD, Aortic valve repair, AF, who presented to the ED with reports of increasing generalized weakness since recent fall for which she was seen in the ED. Patient has a hx of multiple falls, she fell 10/19, and sustained a wrist fracture. She was seen by orthopedic surgeon Dr. Aline Brochure, the plan was for a splint and for the patent to follow up as an outpatient for a cast 10 days later- 10/27/18. Initially patient was able to ambulate without needing assistance, but over the next few days, she required assistance with ambulation, and then couldn't stand without assistance- as her legs would give out, then she couldn't get up from the bed and family couldn't help. Patient has fallen again since her wrist fracture.      Clinical Impression  Patient functioning near baseline for functional mobility and gait.  Patient was requiring assistance for transfers and limited for ambulation since left wrist fracture, has 24/7 caregivers.  Patient to be discharged home today.  Plan:  Patient discharged from physical therapy to care of nursing for out of bed as tolerated for length of stay with recommendations below.     Follow Up Recommendations Home health PT;Supervision/Assistance - 24 hour;Supervision for mobility/OOB    Equipment Recommendations  Cane(wide based quad cane)    Recommendations for Other Services       Precautions / Restrictions Precautions Precautions: Fall Precaution Comments: splint left wrist fracture Restrictions Weight Bearing Restrictions: Yes LUE Weight Bearing: Non weight bearing      Mobility  Bed Mobility Overal bed mobility: Needs Assistance Bed Mobility: Supine to Sit     Supine to sit: Min assist;Mod assist     General bed  mobility comments: slow labored movement  Transfers Overall transfer level: Needs assistance Equipment used: Quad cane;1 person hand held assist Transfers: Sit to/from [facility] Sit to Stand: Min assist;Mod assist Stand pivot transfers: Min assist;Mod assist       General transfer comment: slow labored unsteady movement  Ambulation/Gait Ambulation/Gait assistance: Mod assist;Max assist Gait Distance (Feet): 4 Feet Assistive device: Quad cane Gait Pattern/deviations: Decreased step length - left;Decreased step length - right;Decreased stride length Gait velocity: slow   General Gait Details: limited to 5-6 slow unsteady steps using wide based quad cane [facility]), limited mostly due to fatigue  Stairs            Wheelchair Mobility    Modified Rankin (Stroke Patients Only)       Balance Overall balance assessment: Needs assistance Sitting-balance support: Feet supported;No upper extremity supported Sitting balance-Leahy Scale: Good     Standing balance support: Single extremity supported;During functional activity Standing balance-Leahy Scale: Poor Standing balance comment: fair/poor with Quad-cane                             Pertinent Vitals/Pain Pain Assessment: No/denies pain    Home Living Family/patient expects to be discharged to:: Private residence Living Arrangements: Non-relatives/Friends Available Help at Discharge: Personal care attendant;Family Type of Home: House Home Access: Stairs to enter   [employer] of Steps: 1 Home Layout: One level Home Equipment: [place] - 4 wheels;Cane - single point;Shower seat;Bedside commode;Other (comment)(has hemiwalker) Additional Comments: family states she has hemi-walker  Prior Function Level of Independence: Needs assistance   Gait / Transfers Assistance Needed: 1 person assisted transfers, limited to a few steps during transfers since left wrist fracture  ADL's /  Homemaking Assistance Needed: care givers 24 hours/day x 7 days/week        Hand Dominance        Extremity/Trunk Assessment   Upper Extremity Assessment Upper Extremity Assessment: Generalized weakness;RUE deficits/detail;LUE deficits/detail RUE Deficits / Details: grossly 4/5 LUE Deficits / Details: grossly 3/5 except wrist/hand not tested    Lower Extremity Assessment Lower Extremity Assessment: Generalized weakness    Cervical / Trunk Assessment Cervical / Trunk Assessment: Kyphotic  Communication   Communication: No difficulties;Expressive difficulties  Cognition Arousal/Alertness: Awake/alert Behavior During Therapy: WFL for tasks assessed/performed Overall Cognitive Status: Within Functional Limits for tasks assessed                                        General Comments      Exercises     Assessment/Plan    PT Assessment Patient needs continued PT services;All further PT needs can be met in the next venue of care  PT Problem List Decreased strength;Decreased activity tolerance;Decreased balance;Decreased mobility;Decreased range of motion       PT Treatment Interventions      PT Goals (Current goals can be found in the Care Plan section)  Acute Rehab PT Goals Patient Stated Goal: return home with family/caregivers to assist PT Goal Formulation: With patient/family Time For Goal Achievement: 10/24/18 Potential to Achieve Goals: Good    Frequency     Barriers to discharge        Co-evaluation               AM-PAC PT "6 Clicks" Daily Activity  Outcome Measure Difficulty turning over in bed (including adjusting bedclothes, sheets and blankets)?: A Little Difficulty moving from lying on back to sitting on the side of the bed? : A Lot Difficulty sitting down on and standing up from a chair with arms (e.g., wheelchair, bedside commode, etc,.)?: A Lot Help needed moving to and from a bed to chair (including a wheelchair)?: A  Lot Help needed walking in hospital room?: A Lot Help needed climbing 3-5 steps with a railing? : Total 6 Click Score: 12    End of Session Equipment Utilized During Treatment: Gait belt Activity Tolerance: Patient tolerated treatment well;Patient limited by fatigue Patient left: in chair;with call bell/phone within reach;with chair alarm set;with family/visitor present Nurse Communication: Mobility status PT Visit Diagnosis: Unsteadiness on feet (R26.81);Other abnormalities of gait and mobility (R26.89);Muscle weakness (generalized) (M62.81)    Time: 0301-3143 PT Time Calculation (min) (ACUTE ONLY): 33 min   Charges:   PT Evaluation $PT Eval Moderate Complexity: 1 Mod PT Treatments $Therapeutic Activity: 23-37 mins        12:17 PM, 10/24/18 Lonell Grandchild, MPT Physical Therapist with [facility] [phone] office [phone] mobile phone

## 2018-10-24 NOTE — Care Management Note (Addendum)
Case Management Note  Patient Details  Name: [name] MRN: [id number] Date of Birth: 1922/05/27  Subjective/Objective:      Brought in by family after fall at home. Pt has fx to wrist and has appointment to have cast placed on Monday. Pt lives in her own home, has family to care for her and they have made arrangements with company in [place] to provide [facility] services. Pt has lift and hospital bed being delivered today via [employer] Already has WC at home. Has difficulty leaving home d/t entry way access.            Action/Plan: DC home today. Will make referral for [employer] and PT int he event it is needed. (family expects [facility] company will also do PT with patient). Family has chosen [facility] from provider options. Aware HH has 48 hrs to make first visit. Vaughan Basta, [facility] rep, given referral. Family concerned about getting to appointment on Monday. CM suggested contacting [facility] about transportation benefit or calling [facility] to schedule transport if unable through [facility] Suggested family contact EMS or local fire department to assist getting pt out of home for transport services d/t entry access they are unable to strength required to get pt down steps and around corners.   Expected Discharge Date:        10/24/18          Expected Discharge Plan:  [place]  In-House Referral:  NA  Discharge planning Services  CM Consult  Post Acute Care Choice:  Home Health Choice offered to:  Adult Children  HH Arranged:  RN, PT HH Agency:  [place]  Status of Service:  Completed, signed off   ADDENDUM: family requesting order for quad cane. Order given and sent to [place], Margretta Sidle, RN 10/24/2018, 10:35 AM

## 2018-10-24 NOTE — Progress Notes (Addendum)
Pt's IVs removed and sites intact. EMS called for transport home. Reviewed pt's AVS papers with daughters.

## 2018-10-24 NOTE — Clinical Social Work Note (Signed)
CSW received consult for SNF placement. Patient is going home with HHPT and caregivers.   LCSW signing off.      Unita Detamore, Clydene Pugh, LCSW

## 2018-10-24 NOTE — Discharge Summary (Signed)
Physician Discharge Summary  [name] GQQ:[id number] DOB: 10/23/1922 DOA: 10/23/2018  PCP: Jake Samples, PA-C Orthopedist: Dr. Aline Brochure  Admit date: 10/23/2018 Discharge date: 10/24/2018  Admitted From: HOME  Disposition: HOME with [place]  Recommendations for Outpatient Follow-up:  1. Follow up with PCP in 2 weeks 2. Follow up with orthopedics on 10/27/18 as scheduled  Home Health: PT, RN  Discharge Condition: STABLE   CODE STATUS: FULL    Brief Hospitalization Summary: Please see all hospital notes, images, labs for full details of the hospitalization. HPI: [name] is a 84 y.o. female with medical history significant for HTN, CHF, CAD, Aortic valve repair, AF, who presented to the ED with reports of increasing generalized weakness since recent fall for which she was seen in the ED. Patient has a hx of multiple falls, she fell 10/19, and sustained a wrist fracture. She was seen by orthopedic surgeon Dr. Aline Brochure, the plan was for a splint and for the patent to follow up as an outpatient for a cast 10 days later- 10/27/18. Initially patient was able to ambulate without needing assistance, but over the next few days, she required assistance with ambulation, and then couldn't stand without assistance- as her legs would give out, then she couldn't get up from the bed and family couldn't help. Patient has fallen again since her wrist fracture.    Patient is hard to understand 2/2 slurred speech from cleft palate, but she is mentally at her baseline, awake and alert, denies chest pain, dizziness, denies dysuria. Chronic SOB which family feels is getting worse over the past year, but no acute change. Chronic unchanged productive cough. Chronic unchanged lower extremity swelling. No fevers or chills. She denies pain anywhere even in fractured wrist. Denies extremity weakness. No she has maintained good PO intake but has not had  Bowel movement in at least 5 days. She  is on tramadol for pain. Reports compliance with her lasix.   ED Course: Mildly low temp- 67.1, systolic 245Y- 099I. WBC- 6.3. Cr at baseline 1.75. Stable Hgb 10.9. Lactic acid- 0.96.  Head CT- Negative for acute abnormality. Chest Xray- Small bilat pleural effusion, basilar atelectasis or infiltrates. EKG- Old RBBB. Blood cultures drawn.   The patient has remained medically stable during this observation.  The patient was seen by physical therapy and social work and care management.  Physical therapy has recommended home health physical therapy.  Care management has made the arrangements for this.  The patient has all the necessary equipment at home that has been arranged previously.  Family has agreed to take the patient home.  The family did ask about having orthopedics to see about placing the cast while she is in the hospital.  Unfortunately Dr. Aline Brochure is not on-call today for the hospital.  The patient will have to follow-up with his outpatient office on 10/27/2018 as already scheduled.  I gave the patient and family information regarding fall safety for home.  The patient will be discharged with home health PT and RN to help assist with management.  I also recommended that the patient have a close follow-up appointment with primary care provider for recheck.  The patient has been given additional laxative therapy and also an additional blood pressure lowering medication amlodipine 5 mg daily.  Discharge Diagnoses:  Principal Problem:   Falls frequently Active Problems:   Hypertension   CHF (congestive heart failure) [facility])  Discharge Instructions: Discharge Instructions    Call MD for:  difficulty  breathing, headache or visual disturbances   Complete by:  As directed    Call MD for:  extreme fatigue   Complete by:  As directed    Call MD for:  persistant dizziness or light-headedness   Complete by:  As directed    Call MD for:  persistant nausea and vomiting   Complete by:  As  directed    Call MD for:  severe uncontrolled pain   Complete by:  As directed    Increase activity slowly   Complete by:  As directed      Allergies as of 10/24/2018      Reactions   Fish Allergy Shortness Of Breath, Swelling   Lipitor [atorvastatin Calcium] Rash   Rash all over   Cheese Other (See Comments)   Lips burn when she eats too much   Morphine And Related Nausea And Vomiting   Penicillins Other (See Comments)   unknown   Sulfa Antibiotics Other (See Comments)   unknown      Medication List    TAKE these medications   acetaminophen 325 MG tablet Commonly known as:  TYLENOL Take 650 mg by mouth every 6 (six) hours as needed. For pain   amLODipine 5 MG tablet Commonly known as:  NORVASC Take 1 tablet (5 mg total) by mouth daily.   aspirin EC 81 MG tablet Take 1 tablet (81 mg total) by mouth daily.   carvedilol 12.5 MG tablet Commonly known as:  COREG TAKE 1 TABLET BY MOUTH TWICE DAILY What changed:  when to take this   cetirizine 10 MG tablet Commonly known as:  ZYRTEC Take 10 mg by mouth every morning.   docusate sodium 100 MG capsule Commonly known as:  COLACE Take 100 mg by mouth 3 (three) times daily.   furosemide 40 MG tablet Commonly known as:  LASIX TAKE 1 TABLET(40 MG TOTAL) BY MOUTH ON Monday AND Thursday WITH POTASSIUM.   omeprazole 20 MG capsule Commonly known as:  PRILOSEC take 1 capsule by mouth once daily What changed:  when to take this   polyethylene glycol packet Commonly known as:  MIRALAX / GLYCOLAX Take 17 g by mouth daily.   potassium chloride SA 20 MEQ tablet Commonly known as:  K-DUR,KLOR-CON Take 1 tablet (20 mEq total) by mouth 2 (two) times a week. Take 2 Times Weekly on Monday and Thursday. Take an extra Lasix and potassium tablet for weight gain of 3 lbs in a day or 5 lbs in a week Start taking on:  10/27/2018   rosuvastatin 10 MG tablet Commonly known as:  CRESTOR TAKE 1 TABLET BY MOUTH ONCE DAILY What changed:   when to take this   traMADol 50 MG tablet Commonly known as:  ULTRAM Take 1 tablet (50 mg total) by mouth every 12 (twelve) hours as needed. What changed:  reasons to take this      Follow-up Information    Carole Civil, MD. Go on 10/27/2018.   Specialties:  Orthopedic Surgery, Radiology Why:  As scheduled Contact information: [address] [place] [address] [phone]        Jake Samples, [place]. Schedule an appointment as soon as possible for a visit in 1 week(s).   Specialty:  Family Medicine Why:  Hospital Follow Up  Contact information: [address] [place] [address] [phone]        Fay Records, MD .   Specialty:  Cardiology Contact information: [address]  [place] [address] [phone]          Allergies  Allergen Reactions  . Fish Allergy Shortness Of Breath and Swelling  . Lipitor [Atorvastatin Calcium] Rash    Rash all over  . Cheese Other (See Comments)    Lips burn when she eats too much  . Morphine And Related Nausea And Vomiting  . Penicillins Other (See Comments)    unknown  . Sulfa Antibiotics Other (See Comments)    unknown   Allergies as of 10/24/2018      Reactions   Fish Allergy Shortness Of Breath, Swelling   Lipitor [atorvastatin Calcium] Rash   Rash all over   Cheese Other (See Comments)   Lips burn when she eats too much   Morphine And Related Nausea And Vomiting   Penicillins Other (See Comments)   unknown   Sulfa Antibiotics Other (See Comments)   unknown      Medication List    TAKE these medications   acetaminophen 325 MG tablet Commonly known as:  TYLENOL Take 650 mg by mouth every 6 (six) hours as needed. For pain   amLODipine 5 MG tablet Commonly known as:  NORVASC Take 1 tablet (5 mg total) by mouth daily.   aspirin EC 81 MG tablet Take 1 tablet (81 mg total) by mouth daily.   carvedilol 12.5 MG tablet Commonly known as:  COREG TAKE 1 TABLET BY  MOUTH TWICE DAILY What changed:  when to take this   cetirizine 10 MG tablet Commonly known as:  ZYRTEC Take 10 mg by mouth every morning.   docusate sodium 100 MG capsule Commonly known as:  COLACE Take 100 mg by mouth 3 (three) times daily.   furosemide 40 MG tablet Commonly known as:  LASIX TAKE 1 TABLET(40 MG TOTAL) BY MOUTH ON Monday AND Thursday WITH POTASSIUM.   omeprazole 20 MG capsule Commonly known as:  PRILOSEC take 1 capsule by mouth once daily What changed:  when to take this   polyethylene glycol packet Commonly known as:  MIRALAX / GLYCOLAX Take 17 g by mouth daily.   potassium chloride SA 20 MEQ tablet Commonly known as:  K-DUR,KLOR-CON Take 1 tablet (20 mEq total) by mouth 2 (two) times a week. Take 2 Times Weekly on Monday and Thursday. Take an extra Lasix and potassium tablet for weight gain of 3 lbs in a day or 5 lbs in a week Start taking on:  10/27/2018   rosuvastatin 10 MG tablet Commonly known as:  CRESTOR TAKE 1 TABLET BY MOUTH ONCE DAILY What changed:  when to take this   traMADol 50 MG tablet Commonly known as:  ULTRAM Take 1 tablet (50 mg total) by mouth every 12 (twelve) hours as needed. What changed:  reasons to take this       Procedures/Studies: Dg Chest 2 View  Result Date: 10/23/2018 CLINICAL DATA:  Weakness EXAM: CHEST - 2 VIEW COMPARISON:  09/11/2011 FINDINGS: Post sternotomy changes with valve prosthesis. Small pleural effusions. Mild cardiomegaly. Mild bibasilar atelectasis or infiltrates. Aortic atherosclerosis. Retrocardiac lucency may reflect hiatal hernia although not well localized on lateral view. The distal thoracic aorta appears tortuous. No pneumothorax. IMPRESSION: 1. Small bilateral pleural effusions with basilar atelectasis or infiltrates. Mild cardiomegaly. 2. Suspected retrocardiac lucency as may be seen with hiatal hernia. Electronically Signed   By: Donavan Foil M.D.   On: 10/23/2018 16:11   Dg Wrist Complete  Left  Result Date: 10/18/2018 CLINICAL DATA:  84 year old female  with a history of left wrist pain after a fall EXAM: LEFT WRIST - COMPLETE 3+ VIEW COMPARISON:  None. FINDINGS: Osteopenia. Distal radius fracture with approximately 40 degrees of volar angulation. Fracture line extends to the articular surface. Acute fracture of the ulnar styloid, minimally displaced. Irregularity in the mid pole of the scaphoid, as well as irregularity of the lunate with possible chip fracture on the lateral view. Degenerative changes of the first carpometacarpal joint Degenerative changes of the interphalangeal joints. IMPRESSION: Acute distal radius fracture with 40 degrees of volar angulation and extension of the fracture line to the articular surface. Minimally displaced fracture of the ulnar styloid. Osteopenia limits evaluation, though I suspect there is a scaphoid fracture and lunate fracture. Further evaluation with MRI recommended. Degenerative changes and osteopenia. Electronically Signed   By: Corrie Mckusick D.O.   On: 10/18/2018 14:17   Ct Head Wo Contrast  Result Date: 10/23/2018 CLINICAL DATA:  Fall, head trauma EXAM: CT HEAD WITHOUT CONTRAST TECHNIQUE: Contiguous axial images were obtained from the base of the skull through the vertex without intravenous contrast. COMPARISON:  CT 08/20/2006 FINDINGS: Brain: No acute territorial infarction, hemorrhage or intracranial mass. Atrophy and extensive small vessel ischemic changes of the white matter. Enlarged ventricles felt secondary to atrophy. Vascular: No hyperdense vessels.  Carotid vascular calcification Skull: Normal. Negative for fracture or focal lesion. Sinuses/Orbits: No acute finding. Other: None IMPRESSION: 1. No CT evidence for acute intracranial abnormality. 2. Atrophy with small vessel ischemic changes of the white matter. Electronically Signed   By: Donavan Foil M.D.   On: 10/23/2018 16:08      Subjective: Pt says that her pain is controlled.   She has no complaints.  She is weak but was able to participate with PT.  They have recommended Home health PT which patient has agreed to.    Discharge Exam: Vitals:   10/24/18 0556 10/24/18 0811  BP: (!) 167/78   Pulse: 87   Resp: 18   Temp: 98.8 F (37.1 C)   SpO2: 94% 93%   Vitals:   10/23/18 1846 10/23/18 2135 10/24/18 0556 10/24/18 0811  BP: (!) 180/101 (!) 156/80 (!) 167/78   Pulse: 79 78 87   Resp:  17 18   Temp: 98.2 F (36.8 C) 97.6 F (36.4 C) 98.8 F (37.1 C)   TempSrc: Oral Oral    SpO2: 96% 97% 94% 93%  Weight:       General exam: elderly female, awake, alert, NAD.  Speech impediment.  Respiratory system: Clear. No increased work of breathing. Cardiovascular system: S1 & S2 heard, RRR. No JVD, murmurs, gallops, clicks or pedal edema. Gastrointestinal system: Abdomen is nondistended, soft and nontender. Normal bowel sounds heard. Central nervous system: Alert and oriented. No focal neurological deficits. Extremities: left forearm in soft splint, good distal pulses BUEs. Small bruising noted BLEs.   The results of significant diagnostics from this hospitalization (including imaging, microbiology, ancillary and laboratory) are listed below for reference.     Microbiology: Recent Results (from the past 240 hour(s))  Blood culture (routine x 2)     Status: None (Preliminary result)   Collection Time: 10/23/18  2:34 PM  Result Value Ref Range Status   Specimen Description BLOOD RIGHT WRIST  Final   Special Requests   Final    BOTTLES DRAWN AEROBIC AND ANAEROBIC Blood Culture adequate volume   Culture   Final    NO GROWTH < 24 HOURS Performed at [facility], [address] [place], [place] [address]    Report Status PENDING  Incomplete  Blood culture (routine x 2)     Status: None (Preliminary result)   Collection Time: 10/23/18  2:38 PM  Result Value Ref Range Status   Specimen Description BLOOD RIGHT ANTECUBITAL  Final   Special Requests   Final     BOTTLES DRAWN AEROBIC AND ANAEROBIC Blood Culture adequate volume   Culture   Final    NO GROWTH < 24 HOURS Performed at [facility], [address] [place], [place] [address]    Report Status PENDING  Incomplete     Labs: BNP (last 3 results) No results for input(s): BNP in the last 8760 hours. Basic Metabolic Panel: Recent Labs  Lab 10/23/18 1436  NA 140  K 4.7  CL 107  CO2 26  GLUCOSE 105*  BUN 28*  CREATININE 1.75*  CALCIUM 9.3   Liver Function Tests: Recent Labs  Lab 10/23/18 1436  AST 11*  ALT 11  ALKPHOS 56  BILITOT 0.6  PROT 6.4*  ALBUMIN 3.3*   No results for input(s): LIPASE, AMYLASE in the last 168 hours. No results for input(s): AMMONIA in the last 168 hours. CBC: Recent Labs  Lab 10/23/18 1436  WBC 6.3  NEUTROABS 4.5  HGB 10.9*  HCT 35.9*  MCV 93.5  PLT 294   Cardiac Enzymes: No results for input(s): CKTOTAL, CKMB, CKMBINDEX, TROPONINI in the last 168 hours. BNP: Invalid input(s): POCBNP CBG: No results for input(s): GLUCAP in the last 168 hours. D-Dimer No results for input(s): DDIMER in the last 72 hours. Hgb A1c No results for input(s): HGBA1C in the last 72 hours. Lipid Profile No results for input(s): CHOL, HDL, LDLCALC, TRIG, CHOLHDL, LDLDIRECT in the last 72 hours. Thyroid function studies Recent Labs    10/23/18 1437  TSH 1.482   Anemia work up No results for input(s): VITAMINB12, FOLATE, FERRITIN, TIBC, IRON, RETICCTPCT in the last 72 hours. Urinalysis    Component Value Date/Time   COLORURINE YELLOW 10/23/2018 1950   APPEARANCEUR HAZY (A) 10/23/2018 1950   LABSPEC 1.006 10/23/2018 1950   PHURINE 7.0 10/23/2018 1950   GLUCOSEU NEGATIVE 10/23/2018 1950   HGBUR NEGATIVE 10/23/2018 1950   BILIRUBINUR NEGATIVE 10/23/2018 1950   KETONESUR NEGATIVE 10/23/2018 1950   PROTEINUR NEGATIVE 10/23/2018 1950   UROBILINOGEN 0.2 09/16/2014 1619   NITRITE POSITIVE (A) 10/23/2018 1950   LEUKOCYTESUR LARGE (A) 10/23/2018 1950    Sepsis Labs Invalid input(s): PROCALCITONIN,  WBC,  LACTICIDVEN Microbiology Recent Results (from the past 240 hour(s))  Blood culture (routine x 2)     Status: None (Preliminary result)   Collection Time: 10/23/18  2:34 PM  Result Value Ref Range Status   Specimen Description BLOOD RIGHT WRIST  Final   Special Requests   Final    BOTTLES DRAWN AEROBIC AND ANAEROBIC Blood Culture adequate volume   Culture   Final    NO GROWTH < 24 HOURS Performed at [facility], [address], [place], [place] [address]    Report Status PENDING  Incomplete  Blood culture (routine x 2)     Status: None (Preliminary result)   Collection Time: 10/23/18  2:38 PM  Result Value Ref Range Status   Specimen Description BLOOD RIGHT ANTECUBITAL  Final   Special Requests   Final    BOTTLES DRAWN AEROBIC AND ANAEROBIC Blood Culture adequate volume   Culture   Final    NO GROWTH < 24 HOURS Performed at  [address] [place], [place] [address]    Report Status PENDING  Incomplete   Time coordinating discharge:   SIGNED:  Irwin Brakeman, MD  [facility] 10/24/2018, 10:49 AM Pager [phone]  If 7PM-7AM, please contact night-coverage [email or web address] Password [email or web address]

## 2018-10-24 NOTE — Progress Notes (Signed)
Patient discharged home via EMS with all personal belongings and prescriptions.

## 2018-10-24 NOTE — Progress Notes (Signed)
PROGRESS NOTE  [name]  OFB:[id number]  DOB: 1922-12-31  DOA: 10/23/2018 PCP: Jake Samples, PA-C  Brief Admission Hx: 84 y.o. female with medical history significant for HTN, CHF, CAD, Aortic valve repair, AF, who presented to the ED with reports of increasing generalized weakness since recent fall for which she was seen in the ED. Patient has a hx of multiple falls, she fell 10/19, and sustained a wrist fracture. She was seen by orthopedic surgeon Dr. Aline Brochure, the plan was for a splint and for the patent to follow up as an outpatient for a cast 10 days later- 10/27/18.  Pt has fallen several times at home since the fracture and family unable to get patient up.    MDM/Assessment & Plan:   1. Frequent Falls -patient has an acute wrist fracture that has been temporarily splinted and there are plans for patient to have casting done on 10/27/2018 with Dr. Aline Brochure.  PT evaluation is pending but patient likely is going to require more skilled assistance.  The patient's family is unable to care for her at home.  2. Constipation-the patient has been started on stool softeners and an enema has been ordered.  Hopefully she will have a bowel movement today. 3. Chronic diastolic congestive heart failure-stable on home medications which we have continued. 4. Stage IV CKD- has been stable continue home medications. 5. Coronary artery disease-stable continue statin and aspirin. 6. History of paroxysmal atrial fibrillation-the patient is not anticoagulated due to frequent falling. 7. Essential hypertension-her blood pressures have been elevated, adding amlodipine 5 mg, we are following.  Her home medications have been resumed.  DVT prophylaxis: Lovenox Code Status: Full Family Communication: Son and daughter-in-law Disposition Plan: Likely will require skilled care.  Consultants:  PT  Subjective: Pt without complaints. Still physically very weak. Pain controlled.    Objective: Vitals:   10/23/18 1846 10/23/18 2135 10/24/18 0556 10/24/18 0811  BP: (!) 180/101 (!) 156/80 (!) 167/78   Pulse: 79 78 87   Resp:  17 18   Temp: 98.2 F (36.8 C) 97.6 F (36.4 C) 98.8 F (37.1 C)   TempSrc: Oral Oral    SpO2: 96% 97% 94% 93%  Weight:        Intake/Output Summary (Last 24 hours) at 10/24/2018 0946 Last data filed at 10/24/2018 0900 Gross per 24 hour  Intake 360 ml  Output 550 ml  Net -190 ml   Filed Weights   10/23/18 1327  Weight: 56.7 kg     REVIEW OF SYSTEMS  As per history otherwise all reviewed and reported negative  Exam:  General exam: elderly female, awake, alert, NAD.  Speech impediment.  Respiratory system: Clear. No increased work of breathing. Cardiovascular system: S1 & S2 heard, RRR. No JVD, murmurs, gallops, clicks or pedal edema. Gastrointestinal system: Abdomen is nondistended, soft and nontender. Normal bowel sounds heard. Central nervous system: Alert and oriented. No focal neurological deficits. Extremities: left forearm in soft splint, good distal pulses BUEs. Small bruising noted BLEs.   Data Reviewed: Basic Metabolic Panel: Recent Labs  Lab 10/23/18 1436  NA 140  K 4.7  CL 107  CO2 26  GLUCOSE 105*  BUN 28*  CREATININE 1.75*  CALCIUM 9.3   Liver Function Tests: Recent Labs  Lab 10/23/18 1436  AST 11*  ALT 11  ALKPHOS 56  BILITOT 0.6  PROT 6.4*  ALBUMIN 3.3*   No results for input(s): LIPASE, AMYLASE in the last 168 hours. No results for  input(s): AMMONIA in the last 168 hours. CBC: Recent Labs  Lab 10/23/18 1436  WBC 6.3  NEUTROABS 4.5  HGB 10.9*  HCT 35.9*  MCV 93.5  PLT 294   Cardiac Enzymes: No results for input(s): CKTOTAL, CKMB, CKMBINDEX, TROPONINI in the last 168 hours. CBG (last 3)  No results for input(s): GLUCAP in the last 72 hours. Recent Results (from the past 240 hour(s))  Blood culture (routine x 2)     Status: None (Preliminary result)   Collection Time: 10/23/18   2:34 PM  Result Value Ref Range Status   Specimen Description BLOOD RIGHT WRIST  Final   Special Requests   Final    BOTTLES DRAWN AEROBIC AND ANAEROBIC Blood Culture adequate volume   Culture   Final    NO GROWTH < 24 HOURS Performed at [facility], [address], [place], [place] [address]    Report Status PENDING  Incomplete  Blood culture (routine x 2)     Status: None (Preliminary result)   Collection Time: 10/23/18  2:38 PM  Result Value Ref Range Status   Specimen Description BLOOD RIGHT ANTECUBITAL  Final   Special Requests   Final    BOTTLES DRAWN AEROBIC AND ANAEROBIC Blood Culture adequate volume   Culture   Final    NO GROWTH < 24 HOURS Performed at [facility], [address], [place], [place] [address]    Report Status PENDING  Incomplete     Studies: Dg Chest 2 View  Result Date: 10/23/2018 CLINICAL DATA:  Weakness EXAM: CHEST - 2 VIEW COMPARISON:  09/11/2011 FINDINGS: Post sternotomy changes with valve prosthesis. Small pleural effusions. Mild cardiomegaly. Mild bibasilar atelectasis or infiltrates. Aortic atherosclerosis. Retrocardiac lucency may reflect hiatal hernia although not well localized on lateral view. The distal thoracic aorta appears tortuous. No pneumothorax. IMPRESSION: 1. Small bilateral pleural effusions with basilar atelectasis or infiltrates. Mild cardiomegaly. 2. Suspected retrocardiac lucency as may be seen with hiatal hernia. Electronically Signed   By: Donavan Foil M.D.   On: 10/23/2018 16:11   Ct Head Wo Contrast  Result Date: 10/23/2018 CLINICAL DATA:  Fall, head trauma EXAM: CT HEAD WITHOUT CONTRAST TECHNIQUE: Contiguous axial images were obtained from the base of the skull through the vertex without intravenous contrast. COMPARISON:  CT 08/20/2006 FINDINGS: Brain: No acute territorial infarction, hemorrhage or intracranial mass. Atrophy and extensive small vessel ischemic changes of the white matter. Enlarged ventricles felt secondary to  atrophy. Vascular: No hyperdense vessels.  Carotid vascular calcification Skull: Normal. Negative for fracture or focal lesion. Sinuses/Orbits: No acute finding. Other: None IMPRESSION: 1. No CT evidence for acute intracranial abnormality. 2. Atrophy with small vessel ischemic changes of the white matter. Electronically Signed   By: Donavan Foil M.D.   On: 10/23/2018 16:08   Scheduled Meds: . aspirin EC  81 mg Oral Daily  . carvedilol  12.5 mg Oral BID WC  . enoxaparin (LOVENOX) injection  30 mg Subcutaneous Q24H  . [START ON 10/25/2018] furosemide  40 mg Oral Q48H  . loratadine  10 mg Oral Daily  . mineral oil  1 enema Rectal Once  . pantoprazole  40 mg Oral Daily  . polyethylene glycol  17 g Oral BID  . [START ON 10/27/2018] potassium chloride SA  20 mEq Oral Once per day on Mon Thu  . rosuvastatin  10 mg Oral QPM  . senna-docusate  2 tablet Oral BID   Continuous Infusions:  Principal Problem:   Falls frequently  Active Problems:   Hypertension   CHF (congestive heart failure) ([place])  Time spent:   Irwin Brakeman, MD, FAAFP [facility] Pager [phone]  If 7PM-7AM, please contact night-coverage [email or web address] Password [email or web address] 10/24/2018, 9:46 AM    LOS: 0 days

## 2018-10-24 NOTE — Discharge Instructions (Signed)
Seek medical care or return to ER if symptoms come back, worsen or new problems develop. Please follow-up with orthopedics as scheduled on 10/27/2018 for casting.   Fall Prevention in the Home Falls can cause injuries. They can happen to people of all ages. There are many things you can do to make your home safe and to help prevent falls. What can I do on the outside of my home?  Regularly fix the edges of walkways and driveways and fix any cracks.  Remove anything that might make you trip as you walk through a door, such as a raised step or threshold.  Trim any bushes or trees on the path to your home.  Use bright outdoor lighting.  Clear any walking paths of anything that might make someone trip, such as rocks or tools.  Regularly check to see if handrails are loose or broken. Make sure that both sides of any steps have handrails.  Any raised decks and porches should have guardrails on the edges.  Have any leaves, snow, or ice cleared regularly.  Use sand or salt on walking paths during winter.  Clean up any spills in your garage right away. This includes oil or grease spills. What can I do in the bathroom?  Use night lights.  Install grab bars by the toilet and in the tub and shower. Do not use towel bars as grab bars.  Use non-skid mats or decals in the tub or shower.  If you need to sit down in the shower, use a plastic, non-slip stool.  Keep the floor dry. Clean up any water that spills on the floor as soon as it happens.  Remove soap buildup in the tub or shower regularly.  Attach bath mats securely with double-sided non-slip rug tape.  Do not have throw rugs and other things on the floor that can make you trip. What can I do in the bedroom?  Use night lights.  Make sure that you have a light by your bed that is easy to reach.  Do not use any sheets or blankets that are too big for your bed. They should not hang down onto the floor.  Have a firm chair that  has side arms. You can use this for support while you get dressed.  Do not have throw rugs and other things on the floor that can make you trip. What can I do in the kitchen?  Clean up any spills right away.  Avoid walking on wet floors.  Keep items that you use a lot in easy-to-reach places.  If you need to reach something above you, use a strong step stool that has a grab bar.  Keep electrical cords out of the way.  Do not use floor polish or wax that makes floors slippery. If you must use wax, use non-skid floor wax.  Do not have throw rugs and other things on the floor that can make you trip. What can I do with my stairs?  Do not leave any items on the stairs.  Make sure that there are handrails on both sides of the stairs and use them. Fix handrails that are broken or loose. Make sure that handrails are as long as the stairways.  Check any carpeting to make sure that it is firmly attached to the stairs. Fix any carpet that is loose or worn.  Avoid having throw rugs at the top or bottom of the stairs. If you do have throw rugs, attach them to the  floor with carpet tape.  Make sure that you have a light switch at the top of the stairs and the bottom of the stairs. If you do not have them, ask someone to add them for you. What else can I do to help prevent falls?  Wear shoes that: ? Do not have high heels. ? Have rubber bottoms. ? Are comfortable and fit you well. ? Are closed at the toe. Do not wear sandals.  If you use a stepladder: ? Make sure that it is fully opened. Do not climb a closed stepladder. ? Make sure that both sides of the stepladder are locked into place. ? Ask someone to hold it for you, if possible.  Clearly mark and make sure that you can see: ? Any grab bars or handrails. ? First and last steps. ? Where the edge of each step is.  Use tools that help you move around (mobility aids) if they are needed. These  include: ? Canes. ? Walkers. ? Scooters. ? Crutches.  Turn on the lights when you go into a dark area. Replace any light bulbs as soon as they burn out.  Set up your furniture so you have a clear path. Avoid moving your furniture around.  If any of your floors are uneven, fix them.  If there are any pets around you, be aware of where they are.  Review your medicines with your doctor. Some medicines can make you feel dizzy. This can increase your chance of falling. Ask your doctor what other things that you can do to help prevent falls. This information is not intended to replace advice given to you by your health care provider. Make sure you discuss any questions you have with your health care provider. Document Released: 10/13/2009 Document Revised: 05/24/2016 Document Reviewed: 01/21/2015 Elsevier Interactive Patient Education  2018 [employer].    Constipation, Adult Constipation is when a person has fewer bowel movements in a week than normal, has difficulty having a bowel movement, or has stools that are dry, hard, or larger than normal. Constipation may be caused by an underlying condition. It may become worse with age if a person takes certain medicines and does not take in enough fluids. Follow these instructions at home: Eating and drinking   Eat foods that have a lot of fiber, such as fresh fruits and vegetables, whole grains, and beans.  Limit foods that are high in fat, low in fiber, or overly processed, such as french fries, hamburgers, cookies, candies, and soda.  Drink enough fluid to keep your urine clear or pale yellow. General instructions  Exercise regularly or as told by your health care provider.  Go to the restroom when you have the urge to go. Do not hold it in.  Take over-the-counter and prescription medicines only as told by your health care provider. These include any fiber supplements.  Practice pelvic floor retraining exercises, such as deep  breathing while relaxing the lower abdomen and pelvic floor relaxation during bowel movements.  Watch your condition for any changes.  Keep all follow-up visits as told by your health care provider. This is important. Contact a health care provider if:  You have pain that gets worse.  You have a fever.  You do not have a bowel movement after 4 days.  You vomit.  You are not hungry.  You lose weight.  You are bleeding from the anus.  You have thin, pencil-like stools. Get help right away if:  You have  a fever and your symptoms suddenly get worse.  You leak stool or have blood in your stool.  Your abdomen is bloated.  You have severe pain in your abdomen.  You feel dizzy or you faint. This information is not intended to replace advice given to you by your health care provider. Make sure you discuss any questions you have with your health care provider. Document Released: 09/14/2004 Document Revised: 07/06/2016 Document Reviewed: 06/06/2016 Elsevier Interactive Patient Education  2018 [employer].  Follow with Primary MD  Jake Samples, PA-C  and other consultant's as instructed your Hospitalist MD  Please get a complete blood count and chemistry panel checked by your Primary MD at your next visit, and again as instructed by your Primary MD.  Get Medicines reviewed and adjusted: Please take all your medications with you for your next visit with your Primary MD  Laboratory/radiological data: Please request your Primary MD to go over all hospital tests and procedure/radiological results at the follow up, please ask your Primary MD to get all Hospital records sent to his/her office.  In some cases, they will be blood work, cultures and biopsy results pending at the time of your discharge. Please request that your primary care M.D. follows up on these results.  Also Note the following: If you experience worsening of your admission symptoms, develop shortness of  breath, life threatening emergency, suicidal or homicidal thoughts you must seek medical attention immediately by calling 911 or calling your MD immediately  if symptoms less severe.  You must read complete instructions/literature along with all the possible adverse reactions/side effects for all the Medicines you take and that have been prescribed to you. Take any new Medicines after you have completely understood and accpet all the possible adverse reactions/side effects.   Do not drive when taking Pain medications or sleeping medications (Benzodaizepines)  Do not take more than prescribed Pain, Sleep and Anxiety Medications. It is not advisable to combine anxiety,sleep and pain medications without talking with your primary care practitioner  Special Instructions: If you have smoked or chewed Tobacco  in the last 2 yrs please stop smoking, stop any regular Alcohol  and or any Recreational drug use.  Wear Seat belts while driving.  Please note: You were cared for by a hospitalist during your hospital stay. Once you are discharged, your primary care physician will handle any further medical issues. Please note that NO REFILLS for any discharge medications will be authorized once you are discharged, as it is imperative that you return to your primary care physician (or establish a relationship with a primary care physician if you do not have one) for your post hospital discharge needs so that they can reassess your need for medications and monitor your lab values.

## 2018-10-27 ENCOUNTER — Ambulatory Visit (INDEPENDENT_AMBULATORY_CARE_PROVIDER_SITE_OTHER): Payer: Medicare Other | Admitting: Orthopedic Surgery

## 2018-10-27 ENCOUNTER — Ambulatory Visit (INDEPENDENT_AMBULATORY_CARE_PROVIDER_SITE_OTHER): Payer: Medicare Other

## 2018-10-27 VITALS — BP 125/59 | HR 72 | Ht 64.0 in | Wt 125.0 lb

## 2018-10-27 DIAGNOSIS — S62102D Fracture of unspecified carpal bone, left wrist, subsequent encounter for fracture with routine healing: Secondary | ICD-10-CM | POA: Diagnosis not present

## 2018-10-27 NOTE — Progress Notes (Signed)
Chief Complaint  Patient presents with  . Follow-up    Recheck on left wrist fracture, DOI 10-18-18.    Consult follow-up left Colles' fracture.  Patient 44 with heart disease decided against any intervention other than casting  Her skin looks good today although with very friable  New x-rays show Colles' fracture no change in apex volar angulation and dorsal angulation of the articular surface with mild shortening  Patient placed in short arm cast  X-ray out of plaster 6 weeks  Encounter Diagnosis  Name Primary?  . Closed fracture of left wrist with routine healing, subsequent encounter Yes

## 2018-10-28 ENCOUNTER — Telehealth: Payer: Self-pay | Admitting: Orthopedic Surgery

## 2018-10-28 LAB — CULTURE, BLOOD (ROUTINE X 2)
CULTURE: NO GROWTH
Culture: NO GROWTH
SPECIAL REQUESTS: ADEQUATE
Special Requests: ADEQUATE

## 2018-10-28 NOTE — Telephone Encounter (Signed)
I called she wanted to add OT orders, told her that was fine, just not to work with her wrist that is in the cast, she will send orders over for you to sign  She will be picked up 2 times weekly for PT

## 2018-10-28 NOTE — Telephone Encounter (Signed)
[name] from [facility] called and said she had some questions regarding orders for [name].  I told her that Dr. Aline Brochure was not in the office today.  She wanted to know if there was someone else that could maybe answer just a few questions.  I told her that I would ask you to give her a call later this afternoon when you were free.  She said this was fine.  Would you call her and see if maybe you can answer some of her questions?  Stacey's phone number is [phone]  Thanks so much

## 2018-11-14 ENCOUNTER — Telehealth: Payer: Self-pay | Admitting: Orthopedic Surgery

## 2018-11-14 NOTE — Telephone Encounter (Signed)
Gave verbal

## 2018-11-14 NOTE — Telephone Encounter (Signed)
Call received from [facility], occupational therapist, [place] care, requesting verbal orders for 1x per week for 1 week, then 2x per week for 3 weeks. States will be working on [employer] and AD[employer] transfer, also home safety, including fall prevention. Please leave a detailed message if not available when calling: ph# [phone]

## 2018-12-08 ENCOUNTER — Ambulatory Visit (INDEPENDENT_AMBULATORY_CARE_PROVIDER_SITE_OTHER): Payer: Medicare Other | Admitting: Orthopedic Surgery

## 2018-12-08 ENCOUNTER — Ambulatory Visit (INDEPENDENT_AMBULATORY_CARE_PROVIDER_SITE_OTHER): Payer: Medicare Other

## 2018-12-08 VITALS — BP 104/60 | HR 80 | Ht 64.0 in | Wt 125.0 lb

## 2018-12-08 DIAGNOSIS — S52532D Colles' fracture of left radius, subsequent encounter for closed fracture with routine healing: Secondary | ICD-10-CM

## 2018-12-08 NOTE — Progress Notes (Signed)
Patient ID: [name], female   DOB: 08-01-1922, 84 y.o.   MRN: [id number]  FRACTURE CARE   Chief Complaint  Patient presents with  . Follow-up    Recheck on left wrist fracture, DOI 10-18-18.    Encounter Diagnosis  Name Primary?  . Closed Colles' fracture of left radius with routine healing, subsequent encounter 10/18/18 Yes    POST INJURY DAY: 51  GLOBAL PERIOD DAY 51/90  Cast off x-ray fracture healing pretty good mild angulation mild shortening  She will need a wrist brace because she is using a walker to walk and will need some support  5-week follow-up

## 2018-12-15 ENCOUNTER — Telehealth: Payer: Self-pay | Admitting: Orthopedic Surgery

## 2018-12-15 NOTE — Telephone Encounter (Signed)
Called left message for her to call back so I can advise.

## 2018-12-15 NOTE — Telephone Encounter (Signed)
She has a wrist fracture so options are  Tylenol 500 mg q 6 hrs   Ibuprofen 800 mg q 8 hrs

## 2018-12-15 NOTE — Telephone Encounter (Signed)
Patient's daughter(Judy) called asking what to do. Her mom is saying her wrist is hurting and her fingers are swollen. Daughter stated they loosened the wrist brace some and the swelling has decreased a little.  Please call and advise [phone].

## 2018-12-16 NOTE — Telephone Encounter (Signed)
Called, discussed with patients daughter, and encouraged ROM of fingers, to decrease swelling, offered reassurance

## 2019-01-12 ENCOUNTER — Ambulatory Visit (INDEPENDENT_AMBULATORY_CARE_PROVIDER_SITE_OTHER): Payer: Medicare Other | Admitting: Orthopedic Surgery

## 2019-01-12 ENCOUNTER — Ambulatory Visit (INDEPENDENT_AMBULATORY_CARE_PROVIDER_SITE_OTHER): Payer: Medicare Other

## 2019-01-12 VITALS — BP 108/66 | HR 85 | Ht 64.0 in | Wt 125.0 lb

## 2019-01-12 DIAGNOSIS — S52532D Colles' fracture of left radius, subsequent encounter for closed fracture with routine healing: Secondary | ICD-10-CM

## 2019-01-12 NOTE — Progress Notes (Signed)
Patient ID: [name], female   DOB: 1922/05/04, 85 y.o.   MRN: [id number]  FRACTURE CARE   Chief Complaint  Patient presents with  . Follow-up    Recheck on left wrist fracture, DOI 10-18-18.    Encounter Diagnosis  Name Primary?  . Closed Colles' fracture of left radius with routine healing, subsequent encounter Yes    POST INJURY DAY: 65  GLOBAL PERIOD DAY 86/90  Colles' fracture opted for nonoperative treatment x-ray showed dorsal tilt radial inclination flattening and shortening but deformity was accepted based on patient's age and skin at the time of the fracture  The fracture appears to be healed the patient is having some pain at the radial carpal joint and x-rayed shows arthritis in this region recommend topical medications brace can be removed she was released today

## 2019-01-12 NOTE — Patient Instructions (Signed)
Remove brace exercise as tolerated activities as tolerated  These are the muscle and arthrits creams I recommend:  PLEASE READ THE PACKAGE INSTRUCTIONS BEFORE USING   Ben Gay arthritis cream  Icy hot vanishing gel  Aspercreme odor free  Myoflex Oderless pain reliever  Capzasin  Sportscreme  Max freeze

## 2019-06-13 ENCOUNTER — Observation Stay (HOSPITAL_COMMUNITY)
Admission: EM | Admit: 2019-06-13 | Discharge: 2019-06-14 | Disposition: A | Discharge status: Home / Self Care | Payer: Medicare Other | Attending: Family Medicine | Admitting: Family Medicine

## 2019-06-13 ENCOUNTER — Emergency Department (HOSPITAL_COMMUNITY): Payer: Medicare Other

## 2019-06-13 ENCOUNTER — Encounter (HOSPITAL_COMMUNITY): Payer: Self-pay | Admitting: Emergency Medicine

## 2019-06-13 ENCOUNTER — Other Ambulatory Visit: Payer: Self-pay

## 2019-06-13 DIAGNOSIS — N184 Chronic kidney disease, stage 4 (severe): Secondary | ICD-10-CM | POA: Insufficient documentation

## 2019-06-13 DIAGNOSIS — I5032 Chronic diastolic (congestive) heart failure: Secondary | ICD-10-CM | POA: Diagnosis not present

## 2019-06-13 DIAGNOSIS — Z951 Presence of aortocoronary bypass graft: Secondary | ICD-10-CM | POA: Diagnosis not present

## 2019-06-13 DIAGNOSIS — Z8679 Personal history of other diseases of the circulatory system: Secondary | ICD-10-CM | POA: Insufficient documentation

## 2019-06-13 DIAGNOSIS — I712 Thoracic aortic aneurysm, without rupture: Secondary | ICD-10-CM | POA: Insufficient documentation

## 2019-06-13 DIAGNOSIS — N183 Chronic kidney disease, stage 3 (moderate): Secondary | ICD-10-CM | POA: Insufficient documentation

## 2019-06-13 DIAGNOSIS — I2510 Atherosclerotic heart disease of native coronary artery without angina pectoris: Secondary | ICD-10-CM | POA: Diagnosis not present

## 2019-06-13 DIAGNOSIS — K449 Diaphragmatic hernia without obstruction or gangrene: Secondary | ICD-10-CM | POA: Diagnosis not present

## 2019-06-13 DIAGNOSIS — Z20828 Contact with and (suspected) exposure to other viral communicable diseases: Secondary | ICD-10-CM | POA: Diagnosis not present

## 2019-06-13 DIAGNOSIS — R1310 Dysphagia, unspecified: Secondary | ICD-10-CM

## 2019-06-13 DIAGNOSIS — I480 Paroxysmal atrial fibrillation: Secondary | ICD-10-CM | POA: Insufficient documentation

## 2019-06-13 DIAGNOSIS — Z7982 Long term (current) use of aspirin: Secondary | ICD-10-CM | POA: Insufficient documentation

## 2019-06-13 DIAGNOSIS — I4891 Unspecified atrial fibrillation: Secondary | ICD-10-CM | POA: Diagnosis not present

## 2019-06-13 DIAGNOSIS — Z882 Allergy status to sulfonamides status: Secondary | ICD-10-CM | POA: Insufficient documentation

## 2019-06-13 DIAGNOSIS — D3502 Benign neoplasm of left adrenal gland: Secondary | ICD-10-CM | POA: Insufficient documentation

## 2019-06-13 DIAGNOSIS — J690 Pneumonitis due to inhalation of food and vomit: Secondary | ICD-10-CM | POA: Diagnosis not present

## 2019-06-13 DIAGNOSIS — K8020 Calculus of gallbladder without cholecystitis without obstruction: Secondary | ICD-10-CM | POA: Insufficient documentation

## 2019-06-13 DIAGNOSIS — K219 Gastro-esophageal reflux disease without esophagitis: Secondary | ICD-10-CM | POA: Diagnosis not present

## 2019-06-13 DIAGNOSIS — K228 Other specified diseases of esophagus: Secondary | ICD-10-CM | POA: Insufficient documentation

## 2019-06-13 DIAGNOSIS — Z885 Allergy status to narcotic agent status: Secondary | ICD-10-CM | POA: Diagnosis not present

## 2019-06-13 DIAGNOSIS — Z79899 Other long term (current) drug therapy: Secondary | ICD-10-CM | POA: Insufficient documentation

## 2019-06-13 DIAGNOSIS — R9082 White matter disease, unspecified: Secondary | ICD-10-CM | POA: Insufficient documentation

## 2019-06-13 DIAGNOSIS — I130 Hypertensive heart and chronic kidney disease with heart failure and stage 1 through stage 4 chronic kidney disease, or unspecified chronic kidney disease: Secondary | ICD-10-CM | POA: Diagnosis not present

## 2019-06-13 DIAGNOSIS — I10 Essential (primary) hypertension: Secondary | ICD-10-CM | POA: Diagnosis present

## 2019-06-13 DIAGNOSIS — Z952 Presence of prosthetic heart valve: Secondary | ICD-10-CM | POA: Insufficient documentation

## 2019-06-13 DIAGNOSIS — I719 Aortic aneurysm of unspecified site, without rupture: Secondary | ICD-10-CM | POA: Diagnosis present

## 2019-06-13 DIAGNOSIS — Z880 Allergy status to penicillin: Secondary | ICD-10-CM | POA: Diagnosis not present

## 2019-06-13 DIAGNOSIS — E785 Hyperlipidemia, unspecified: Secondary | ICD-10-CM | POA: Insufficient documentation

## 2019-06-13 HISTORY — DX: Cleft palate, unspecified: Q35.9

## 2019-06-13 HISTORY — DX: Ehlers-Danlos syndrome, unspecified: Q79.60

## 2019-06-13 LAB — BASIC METABOLIC PANEL
Anion gap: 15 (ref 5–15)
BUN: 34 mg/dL — ABNORMAL HIGH (ref 8–23)
CO2: 22 mmol/L (ref 22–32)
Calcium: 10.1 mg/dL (ref 8.9–10.3)
Chloride: 108 mmol/L (ref 98–111)
Creatinine, Ser: 1.63 mg/dL — ABNORMAL HIGH (ref 0.44–1.00)
GFR calc Af Amer: 31 mL/min — ABNORMAL LOW (ref >60)
GFR calc non Af Amer: 27 mL/min — ABNORMAL LOW (ref >60)
Glucose, Bld: 128 mg/dL — ABNORMAL HIGH (ref 70–99)
Potassium: 4.2 mmol/L (ref 3.5–5.1)
Sodium: 145 mmol/L (ref 135–145)

## 2019-06-13 LAB — CBC WITH DIFFERENTIAL/PLATELET
Abs Immature Granulocytes: 0.09 10*3/uL — ABNORMAL HIGH (ref 0.00–0.07)
Basophils Absolute: 0 10*3/uL (ref 0.0–0.1)
Basophils Relative: 0 %
Eosinophils Absolute: 0.1 10*3/uL (ref 0.0–0.5)
Eosinophils Relative: 0 %
HCT: 33.1 % — ABNORMAL LOW (ref 36.0–46.0)
Hemoglobin: 10.1 g/dL — ABNORMAL LOW (ref 12.0–15.0)
Immature Granulocytes: 1 %
Lymphocytes Relative: 5 %
Lymphs Abs: 0.9 10*3/uL (ref 0.7–4.0)
MCH: 27.3 pg (ref 26.0–34.0)
MCHC: 30.5 g/dL (ref 30.0–36.0)
MCV: 89.5 fL (ref 80.0–100.0)
Monocytes Absolute: 0.5 10*3/uL (ref 0.1–1.0)
Monocytes Relative: 3 %
Neutro Abs: 15.7 10*3/uL — ABNORMAL HIGH (ref 1.7–7.7)
Neutrophils Relative %: 91 %
Platelets: 331 10*3/uL (ref 150–400)
RBC: 3.7 MIL/uL — ABNORMAL LOW (ref 3.87–5.11)
RDW: 14.2 % (ref 11.5–15.5)
WBC: 17.3 10*3/uL — ABNORMAL HIGH (ref 4.0–10.5)
nRBC: 0 % (ref 0.0–0.2)

## 2019-06-13 LAB — MAGNESIUM: Magnesium: 2.4 mg/dL (ref 1.7–2.4)

## 2019-06-13 MED ORDER — CLINDAMYCIN PHOSPHATE 600 MG/50ML IV SOLN
600.0000 mg | Freq: Three times a day (TID) | INTRAVENOUS | Status: DC
  Administered 2019-06-13 – 2019-06-14 (×3): 600 mg via INTRAVENOUS
  Filled 2019-06-13 (×3): qty 50

## 2019-06-13 MED ORDER — LABETALOL HCL 5 MG/ML IV SOLN
10.0000 mg | INTRAVENOUS | Status: DC | PRN
  Administered 2019-06-13: 10 mg via INTRAVENOUS

## 2019-06-13 MED ORDER — LABETALOL HCL 5 MG/ML IV SOLN
5.0000 mg | INTRAVENOUS | Status: AC | PRN
  Administered 2019-06-13 (×2): 5 mg via INTRAVENOUS
  Filled 2019-06-13: qty 4

## 2019-06-13 MED ORDER — PROMETHAZINE HCL 12.5 MG PO TABS: 12.5000 mg | ORAL_TABLET | Freq: Four times a day (QID) | ORAL | Status: DC | PRN

## 2019-06-13 MED ORDER — DEXTROSE-NACL 5-0.45 % IV SOLN
INTRAVENOUS | Status: DC
  Administered 2019-06-13: 18:00:00 via INTRAVENOUS

## 2019-06-13 NOTE — ED Notes (Signed)
covid spec to lab

## 2019-06-13 NOTE — ED Notes (Signed)
To CT

## 2019-06-13 NOTE — ED Notes (Signed)
Report to Larena Glassman, RN  Family reports that they will bring pt hearing aid charger and necessities later

## 2019-06-13 NOTE — ED Notes (Signed)
Dr E in to assess 

## 2019-06-13 NOTE — ED Provider Notes (Addendum)
[facility] Provider Note   CSN: [id number] Arrival date & time: 06/13/19  1141    History   Chief Complaint Chief Complaint  Patient presents with   Swallowed Foreign Body   Facial Swelling    HPI [name] is a 85 y.o. female with a history of anemia, CAD, CHF aortic stenosis, cleft palate, GERD, hypertension, atrial fibrillation presenting with a 2-day history of difficulty swallowing fluids and food also questionable difficulty swallowing saliva, pt states she can, but also is spitting into a basin during visit.  At baseline she is on a soft food diet, pt lives alone but has family that helps her in her home.  She endorses being able to swallow her medications since her symptoms began, however but states water frequently gets regurgitated. She denies painful swallowing, denies sob.  3:46 PM  Call from dg Hoskins, [phone].  Adds that her mother normally does have wet sounding phonation when she first wakes in the am, but would clear shortly after waking and can speak very clearly.       The history is provided by the patient and medical records.    Past Medical History:  Diagnosis Date   Anemia    Aortic stenosis    CAD (coronary artery disease)    CHF (congestive heart failure) (HCC)    Cleft palate    GERD (gastroesophageal reflux disease)    Hypertension     Patient Active Problem List   Diagnosis Date Noted   Dysphagia 06/13/2019   Falls frequently 10/23/2018   Fracture, [place], left, closed 10/18/18    S/P AVR (aortic valve replacement) 07/31/2017   Sinus tachycardia 11/01/2014   UTI (urinary tract infection) 09/16/2014   Dizziness 05/27/2012   Hyperlipidemia 09/23/2011   Atrial fibrillation ([place]) 09/23/2011   Hypertension    Anemia    GERD (gastroesophageal reflux disease)    CHF (congestive heart failure) (HCC)    CAD (coronary artery disease)    Aortic stenosis     Past Surgical History:    Procedure Laterality Date   ABDOMINAL HYSTERECTOMY     AORTIC VALVE REPLACEMENT  08/10/11   CARDIAC SURGERY     CATARACT EXTRACTION     CORONARY ARTERY BYPASS GRAFT  08/19/22   CYSTOSTOMY     HERNIA REPAIR     TONSILLECTOMY       OB History    Gravida  3   Para  3   Term  3   Preterm      AB      Living  3     SAB      TAB      Ectopic      Multiple      Live Births               Home Medications    Prior to Admission medications   Medication Sig Start Date End Date Taking? Authorizing Provider  acetaminophen (TYLENOL) 325 MG tablet Take 650 mg by mouth every 6 (six) hours as needed. For pain    Yes [provider]  aspirin EC 81 MG tablet Take 1 tablet (81 mg total) by mouth daily. 10/10/18  Yes Barrett, Evelene Croon, PA-C  carvedilol (COREG) 12.5 MG tablet TAKE 1 TABLET BY MOUTH TWICE DAILY Patient taking differently: Take 12.5 mg by mouth 2 (two) times daily with a meal.  08/07/18  Yes Fay Records, MD  cetirizine (ZYRTEC) 10 MG tablet Take  10 mg by mouth every morning.    Yes [provider]  docusate sodium (COLACE) 100 MG capsule Take 100 mg by mouth 3 (three) times daily.    Yes [provider]  furosemide (LASIX) 40 MG tablet TAKE 1 TABLET(40 MG TOTAL) BY MOUTH ON Monday AND Thursday WITH POTASSIUM. 10/24/18  Yes Johnson, Clanford L, MD  omeprazole (PRILOSEC) 20 MG capsule take 1 capsule by mouth once daily Patient taking differently: Take 20 mg by mouth every morning.  10/22/17  Yes Setzer, Terri L, NP  potassium chloride SA (K-DUR,KLOR-CON) 20 MEQ tablet Take 1 tablet (20 mEq total) by mouth 2 (two) times a week. Take 2 Times Weekly on Monday and Thursday. Take an extra Lasix and potassium tablet for weight gain of 3 lbs in a day or 5 lbs in a week 10/27/18  Yes Johnson, Clanford L, MD  QUEtiapine (SEROQUEL) 50 MG tablet Take 50 mg by mouth daily.  05/28/19  Yes [provider]  rosuvastatin (CRESTOR) 10 MG tablet  TAKE 1 TABLET BY MOUTH ONCE DAILY Patient taking differently: Take 10 mg by mouth every evening.  10/08/18  Yes Imogene Burn, PA-C  traMADol (ULTRAM) 50 MG tablet Take 1 tablet (50 mg total) by mouth every 12 (twelve) hours as needed. Patient taking differently: Take 50 mg by mouth every 12 (twelve) hours as needed for moderate pain.  10/18/18  Yes Davonna Belling, MD    Family History Family History  Problem Relation Age of Onset   Heart failure Father    Heart attack Father     Social History Social History   Tobacco Use   Smoking status: Never Smoker   Smokeless tobacco: Never Used  Substance Use Topics   Alcohol use: No   Drug use: No     Allergies   Fish allergy, Lipitor [atorvastatin calcium], Cheese, Morphine and related, Penicillins, and Sulfa antibiotics   Review of Systems Review of Systems  Constitutional: Negative for fever.  HENT: Positive for trouble swallowing. Negative for congestion and sore throat.   Eyes: Negative.   Respiratory: Negative for chest tightness and shortness of breath.   Cardiovascular: Negative for chest pain.  Gastrointestinal: Negative for abdominal pain and nausea.  Genitourinary: Negative.   Musculoskeletal: Negative for arthralgias, joint swelling and neck pain.  Skin: Negative.  Negative for rash and wound.  Neurological: Negative for dizziness, weakness, light-headedness, numbness and headaches.  Psychiatric/Behavioral: Negative.      Physical Exam Updated Vital Signs BP (!) 179/87    Pulse 76    Temp 98.7 F (37.1 C) (Oral)    Resp 18    Ht 5\' 7"  (1.702 m)    Wt 56.7 kg    SpO2 93%    BMI 19.58 kg/m   Physical Exam Vitals signs and nursing note reviewed.  Constitutional:      Appearance: She is well-developed.  HENT:     Head: Normocephalic and atraumatic.     Comments: No facial edema appreciated    Mouth/Throat:     Mouth: Mucous membranes are moist.     Comments: Pt is swallowing her saliva.  Voice is  "wet" sounding, difficult to understand phonation.  Eyes:     Conjunctiva/sclera: Conjunctivae normal.  Neck:     Musculoskeletal: Normal range of motion.  Cardiovascular:     Rate and Rhythm: Normal rate and regular rhythm.     Heart sounds: Normal heart sounds.  Pulmonary:     Effort: Pulmonary  effort is normal.     Breath sounds: Normal breath sounds. No stridor. No wheezing or rhonchi.  Abdominal:     General: Bowel sounds are normal. There is no distension.     Palpations: Abdomen is soft.     Tenderness: There is no abdominal tenderness. There is no guarding.  Musculoskeletal: Normal range of motion.  Skin:    General: Skin is warm and dry.  Neurological:     General: No focal deficit present.     Mental Status: She is alert and oriented to person, place, and time.     Cranial Nerves: No cranial nerve deficit.      ED Treatments / Results  Labs (all labs ordered are listed, but only abnormal results are displayed) Labs Reviewed  CBC WITH DIFFERENTIAL/PLATELET - Abnormal; Notable for the following components:      Result Value   WBC 17.3 (*)    RBC 3.70 (*)    Hemoglobin 10.1 (*)    HCT 33.1 (*)    Neutro Abs 15.7 (*)    Abs Immature Granulocytes 0.09 (*)    All other components within normal limits  BASIC METABOLIC PANEL - Abnormal; Notable for the following components:   Glucose, Bld 128 (*)    BUN 34 (*)    Creatinine, Ser 1.63 (*)    GFR calc non Af Amer 27 (*)    GFR calc Af Amer 31 (*)    All other components within normal limits  NOVEL CORONAVIRUS, NAA (HOSPITAL ORDER, SEND-OUT TO REF LAB)  MAGNESIUM    EKG None  ED ECG REPORT   Date: 06/13/2019  Rate: 84   Rhythm: normal sinus rhythm  QRS Axis: left  Intervals: QT prolonged  ST/T Wave abnormalities: nonspecific ST changes  Conduction Disutrbances:right bundle branch block  Narrative Interpretation: QT prolongation new, although borderline with prior ekg 10/23/18  Old EKG Reviewed: changes  noted  I have personally reviewed the EKG tracing and agree with the computerized printout as noted.  Radiology Ct Head Wo Contrast  Result Date: 06/13/2019 CLINICAL DATA:  Focal neuro deficit.  Suspected stroke. EXAM: CT HEAD WITHOUT CONTRAST TECHNIQUE: Contiguous axial images were obtained from the base of the skull through the vertex without intravenous contrast. COMPARISON:  October 23, 2018 FINDINGS: Brain: No subdural, epidural, or subarachnoid hemorrhage. Ventricles and sulci are prominent but stable. Severe white matter changes are noted. Cerebellum, brainstem, and basal cisterns are normal. No acute cortical ischemia or infarct. No mass effect or midline shift. Vascular: Calcified atherosclerosis in the intracranial carotids. Skull: Normal. Negative for fracture or focal lesion. Sinuses/Orbits: Paranasal sinuses, right mastoid air cells, and middle ears are normal. There is scattered opacification of left mastoid air cells without associated soft tissue swelling or bony erosion. Other: None. IMPRESSION: Chronic white matter changes. No acute intracranial abnormalities identified. No acute stroke or bleed noted. Electronically Signed   By: Dorise Bullion III M.D   On: 06/13/2019 13:59   Ct Chest Wo Contrast  Result Date: 06/13/2019 CLINICAL DATA:  New onset dysphagia. Assess for thoracic mass or esophageal pathology. EXAM: CT CHEST WITHOUT CONTRAST TECHNIQUE: Multidetector CT imaging of the chest was performed following the standard protocol without IV contrast. COMPARISON:  None. FINDINGS: Cardiovascular: Cardiomegaly. The patient is status post aortic valve replacement. The ascending thoracic aorta measures 4.3 cm proximally. Atherosclerosis seen in the thoracic aorta. The main pulmonary artery is normal in caliber. Coronary artery calcifications are identified. Mediastinum/Nodes: There is a moderate  hiatal hernia. The esophagus is dilated with fluid and debris. The fluid and debris extends  into the proximal esophagus. An underlying cause is not identified. The thyroid is normal. No adenopathy. No effusions. Lungs/Pleura: Central airways are normal. No pneumothorax. Infiltrate is seen in the bilateral lung bases, right greater than left, worrisome for pneumonia or aspiration. Given the history, aspiration should be considered. Mild opacity in the lingula identified as well. No suspicious nodules or masses. Upper Abdomen: Cholelithiasis.  Adenoma in the left adrenal gland. Musculoskeletal: No chest wall mass or suspicious bone lesions identified. IMPRESSION: 1. The esophagus is filled with fluid and debris extending from the GE junction into the proximal esophagus. An underlying cause is not seen. A distal mass or stricture could explain these findings. Other etiologies such as achalasia are possible although I would expect more dilatation of the esophagus in the setting of achalasia. Recommend consultation with gastroenterology. It is unlikely the patient could have a barium study for further evaluation given the amount of debris and fluid in the esophagus. 2. Moderate hiatal hernia. 3. Bibasilar pulmonary infiltrates. Mild infiltrate in the lingula. Given the debris in the esophagus, I would favor aspiration. Pneumonia is possible. 4. Mild aneurysmal dilatation of the ascending thoracic aorta measuring up to 4.3 cm. Recommend annual imaging followup by CTA or MRA. This recommendation follows 2010 ACCF/AHA/AATS/ACR/ASA/SCA/SCAI/SIR/STS/SVM Guidelines for the Diagnosis and Management of Patients with Thoracic Aortic Disease. Circulation. 2010; 121: Y694-W546. Aortic aneurysm NOS (ICD10-I71.9) 5. Atherosclerotic change in the thoracic aorta. Coronary artery calcifications. 6. Cholelithiasis. 7. Left adrenal adenoma. Aortic Atherosclerosis (ICD10-I70.0). Electronically Signed   By: Dorise Bullion III M.D   On: 06/13/2019 14:14    Procedures Procedures (including critical care time)  Medications  Ordered in ED Medications - No data to display   Initial Impression / Assessment and Plan / ED Course  I have reviewed the triage vital signs and the nursing notes.  Pertinent labs & imaging results that were available during my care of the patient were reviewed by me and considered in my medical decision making (see chart for details).        3:46 PM Pt discussed with Dr. Laural Golden. Will be willing to consult pt - will see in am and plan for upper EGD if pt/family willing.  Advised keeping pt npo.  3:46 PM  discussed with Dr. Denton Brick who will see and admit pt.  Discussed diagnosis of probable aspiration pneumonia.  EKG with QT prolongation, pt allergy to PCN, reaction unclear.  Will defer to Dr Denton Brick for abx choice.    Also spoke with pt's dg Bethena Roys and advised of work up and plan for admission.  She added home phone # as cell phone not reliable at her home - please use [phone]    Final Clinical Impressions(s) / ED Diagnoses   Final diagnoses:  Aspiration pneumonia of both lungs due to regurgitated food, unspecified part of lung [facility])  Dysphagia, unspecified type    ED Discharge Orders    None       Landis Martins 06/13/19 1546    Noemi Chapel, MD July 12, 2019 0655    Evalee Jefferson, PA-C 07/01/19 1549    Noemi Chapel, MD 07/16/19 [phone]

## 2019-06-13 NOTE — ED Notes (Signed)
From rad  Call from daughter Flo Shanks    [phone] Updated

## 2019-06-13 NOTE — ED Notes (Signed)
Call to daughter Herbie Saxon  [phone]  Informed of pending admission as well as floor nurses station number should she have questions

## 2019-06-13 NOTE — ED Triage Notes (Signed)
Difficulty swallowing for 2 days better today"  From home   Noted facial swelling to L face

## 2019-06-13 NOTE — H&P (Addendum)
History and Physical    [name] IRC:[id number] DOB: May 23, 1922 DOA: 06/13/2019  PCP: Jake Samples, PA-C   Patient coming from: Home  I have personally briefly reviewed patient's old medical records in [place]  Chief Complaint: Difficulty swallowing  HPI: [name] is a 85 y.o. female with medical history significant for hypertension, CHF, coronary artery disease, aortic stenosis, CKD 4 atrial fibrillation, aortic valve replacement was brought to the ED with complaints of difficulty swallowing over the past 2 days.  Patient denies pain with swallowing.  She also reports a cough productive of phlegm.  She denies difficulty breathing.  Patient is able to answer most of my questions but she is hard of hearing and because she has cleft palate it is at times difficult to discern exactly what she is saying.  Daughter denies fevers at home.  ED Course: Blood pressure Systolic 510C to 585I, O2 sats 88% on room air, increased to greater than 91% with 2 L nasal cannula.  WBC 17.3, worsening about baseline at 1.63.  CT head no acute intracranial abnormalities.  Chest CT without contrast-esophagus filled with fluid and debris extending from the gastroesophageal junction into the proximal esophagus.  Underlying cause not seen.  Distal muscle stricture could explain findings. ( Please see detailed report).  Possible achalasia, .  GI consult recommended.  Bibasilar pulmonary infiltrates aspiration favored.  Pneumonia possible. EDP asked to Dr. Melony Overly on call for gastroenterologist plan for possible EGD tomorrow.  Review of Systems: As per HPI all other systems reviewed and negative.  Past Medical History:  Diagnosis Date   Anemia    Aortic stenosis    CAD (coronary artery disease)    CHF (congestive heart failure) (HCC)    Cleft palate    GERD (gastroesophageal reflux disease)    Hypertension     Past Surgical History:  Procedure Laterality Date   ABDOMINAL  HYSTERECTOMY     AORTIC VALVE REPLACEMENT  08/10/11   CARDIAC SURGERY     CATARACT EXTRACTION     CORONARY ARTERY BYPASS GRAFT  08/19/22   CYSTOSTOMY     HERNIA REPAIR     TONSILLECTOMY       reports that she has never smoked. She has never used smokeless tobacco. She reports that she does not drink alcohol or use drugs.  Allergies  Allergen Reactions   Fish Allergy Shortness Of Breath and Swelling   Lipitor [Atorvastatin Calcium] Rash    Rash all over   Cheese Other (See Comments)    Lips burn when she eats too much   Morphine And Related Nausea And Vomiting   Penicillins Other (See Comments)    unknown   Sulfa Antibiotics Other (See Comments)    unknown    Family History  Problem Relation Age of Onset   Heart failure Father    Heart attack Father     Prior to Admission medications   Medication Sig Start Date End Date Taking? Authorizing Provider  acetaminophen (TYLENOL) 325 MG tablet Take 650 mg by mouth every 6 (six) hours as needed. For pain    Yes [provider]  aspirin EC 81 MG tablet Take 1 tablet (81 mg total) by mouth daily. 10/10/18  Yes Barrett, Evelene Croon, PA-C  carvedilol (COREG) 12.5 MG tablet TAKE 1 TABLET BY MOUTH TWICE DAILY Patient taking differently: Take 12.5 mg by mouth 2 (two) times daily with a meal.  08/07/18  Yes Fay Records, MD  cetirizine (ZYRTEC) 10 MG tablet Take 10 mg by mouth every morning.    Yes [provider]  docusate sodium (COLACE) 100 MG capsule Take 100 mg by mouth 3 (three) times daily.    Yes [provider]  furosemide (LASIX) 40 MG tablet TAKE 1 TABLET(40 MG TOTAL) BY MOUTH ON Monday AND Thursday WITH POTASSIUM. 10/24/18  Yes Johnson, Clanford L, MD  omeprazole (PRILOSEC) 20 MG capsule take 1 capsule by mouth once daily Patient taking differently: Take 20 mg by mouth every morning.  10/22/17  Yes Setzer, Terri L, NP  potassium chloride SA (K-DUR,KLOR-CON) 20 MEQ tablet Take 1 tablet (20 mEq  total) by mouth 2 (two) times a week. Take 2 Times Weekly on Monday and Thursday. Take an extra Lasix and potassium tablet for weight gain of 3 lbs in a day or 5 lbs in a week 10/27/18  Yes Johnson, Clanford L, MD  QUEtiapine (SEROQUEL) 50 MG tablet Take 50 mg by mouth daily.  05/28/19  Yes [provider]  rosuvastatin (CRESTOR) 10 MG tablet TAKE 1 TABLET BY MOUTH ONCE DAILY Patient taking differently: Take 10 mg by mouth every evening.  10/08/18  Yes Imogene Burn, PA-C  traMADol (ULTRAM) 50 MG tablet Take 1 tablet (50 mg total) by mouth every 12 (twelve) hours as needed. Patient taking differently: Take 50 mg by mouth every 12 (twelve) hours as needed for moderate pain.  10/18/18  Yes Davonna Belling, MD    Physical Exam: Vitals:   06/13/19 1300 06/13/19 1419 06/13/19 1422 06/13/19 1430  BP: (!) 163/89 (!) 173/97 (!) 173/97 (!) 179/87  Pulse: 81 84  76  Resp:      Temp:      TempSrc:      SpO2: 94% 93%  93%  Weight:      Height:        Constitutional: NAD, calm, comfortable Vitals:   06/13/19 1300 06/13/19 1419 06/13/19 1422 06/13/19 1430  BP: (!) 163/89 (!) 173/97 (!) 173/97 (!) 179/87  Pulse: 81 84  76  Resp:      Temp:      TempSrc:      SpO2: 94% 93%  93%  Weight:      Height:       Eyes: PERRL, lids and conjunctivae normal ENMT: Mucous membranes are moist. Posterior pharynx clear of any exudate or lesions. Neck: normal, supple, no masses, no thyromegaly Respiratory: clear to auscultation bilaterally, no wheezing, no crackles. Normal respiratory effort. No accessory muscle use.  Cardiovascular: Regular rate and rhythm, no murmurs / rubs / gallops. No extremity edema. 2+ pedal pulses.   Abdomen: no tenderness, no masses palpated. No hepatosplenomegaly. Bowel sounds positive.  Musculoskeletal: no clubbing / cyanosis. No joint deformity upper and lower extremities. Good ROM, no contractures. Normal muscle tone.  Skin: Hyperpigmentated areas on lower  extremities, no ulcers. No induration Neurologic: Cleft palate, so speech a bit difficult to discern , otherwise no obvious cranial nerve deficit, strength 5/5 in all 4.  Psychiatric: Normal judgment and insight. Alert and oriented x 3. Normal mood.   Labs on Admission: I have personally reviewed following labs and imaging studies  CBC: Recent Labs  Lab 06/13/19 1227  WBC 17.3*  NEUTROABS 15.7*  HGB 10.1*  HCT 33.1*  MCV 89.5  PLT 704   Basic Metabolic Panel: Recent Labs  Lab 06/13/19 1227 06/13/19 1537  NA 145  --   K 4.2  --   CL 108  --  CO2 22  --   GLUCOSE 128*  --   BUN 34*  --   CREATININE 1.63*  --   CALCIUM 10.1  --   MG  --  2.4   Urine analysis:    Component Value Date/Time   COLORURINE YELLOW 10/23/2018 1950   APPEARANCEUR HAZY (A) 10/23/2018 1950   LABSPEC 1.006 10/23/2018 1950   PHURINE 7.0 10/23/2018 1950   GLUCOSEU NEGATIVE 10/23/2018 1950   HGBUR NEGATIVE 10/23/2018 1950   BILIRUBINUR NEGATIVE 10/23/2018 1950   KETONESUR NEGATIVE 10/23/2018 1950   PROTEINUR NEGATIVE 10/23/2018 1950   UROBILINOGEN 0.2 09/16/2014 1619   NITRITE POSITIVE (A) 10/23/2018 1950   LEUKOCYTESUR LARGE (A) 10/23/2018 1950    Radiological Exams on Admission: Ct Head Wo Contrast  Result Date: 06/13/2019 CLINICAL DATA:  Focal neuro deficit.  Suspected stroke. EXAM: CT HEAD WITHOUT CONTRAST TECHNIQUE: Contiguous axial images were obtained from the base of the skull through the vertex without intravenous contrast. COMPARISON:  October 23, 2018 FINDINGS: Brain: No subdural, epidural, or subarachnoid hemorrhage. Ventricles and sulci are prominent but stable. Severe white matter changes are noted. Cerebellum, brainstem, and basal cisterns are normal. No acute cortical ischemia or infarct. No mass effect or midline shift. Vascular: Calcified atherosclerosis in the intracranial carotids. Skull: Normal. Negative for fracture or focal lesion. Sinuses/Orbits: Paranasal sinuses, right  mastoid air cells, and middle ears are normal. There is scattered opacification of left mastoid air cells without associated soft tissue swelling or bony erosion. Other: None. IMPRESSION: Chronic white matter changes. No acute intracranial abnormalities identified. No acute stroke or bleed noted. Electronically Signed   By: Dorise Bullion III M.D   On: 06/13/2019 13:59   Ct Chest Wo Contrast  Result Date: 06/13/2019 CLINICAL DATA:  New onset dysphagia. Assess for thoracic mass or esophageal pathology. EXAM: CT CHEST WITHOUT CONTRAST TECHNIQUE: Multidetector CT imaging of the chest was performed following the standard protocol without IV contrast. COMPARISON:  None. FINDINGS: Cardiovascular: Cardiomegaly. The patient is status post aortic valve replacement. The ascending thoracic aorta measures 4.3 cm proximally. Atherosclerosis seen in the thoracic aorta. The main pulmonary artery is normal in caliber. Coronary artery calcifications are identified. Mediastinum/Nodes: There is a moderate hiatal hernia. The esophagus is dilated with fluid and debris. The fluid and debris extends into the proximal esophagus. An underlying cause is not identified. The thyroid is normal. No adenopathy. No effusions. Lungs/Pleura: Central airways are normal. No pneumothorax. Infiltrate is seen in the bilateral lung bases, right greater than left, worrisome for pneumonia or aspiration. Given the history, aspiration should be considered. Mild opacity in the lingula identified as well. No suspicious nodules or masses. Upper Abdomen: Cholelithiasis.  Adenoma in the left adrenal gland. Musculoskeletal: No chest wall mass or suspicious bone lesions identified. IMPRESSION: 1. The esophagus is filled with fluid and debris extending from the GE junction into the proximal esophagus. An underlying cause is not seen. A distal mass or stricture could explain these findings. Other etiologies such as achalasia are possible although I would expect  more dilatation of the esophagus in the setting of achalasia. Recommend consultation with gastroenterology. It is unlikely the patient could have a barium study for further evaluation given the amount of debris and fluid in the esophagus. 2. Moderate hiatal hernia. 3. Bibasilar pulmonary infiltrates. Mild infiltrate in the lingula. Given the debris in the esophagus, I would favor aspiration. Pneumonia is possible. 4. Mild aneurysmal dilatation of the ascending thoracic aorta measuring up to 4.3 cm.  Recommend annual imaging followup by CTA or MRA. This recommendation follows 2010 ACCF/AHA/AATS/ACR/ASA/SCA/SCAI/SIR/STS/SVM Guidelines for the Diagnosis and Management of Patients with Thoracic Aortic Disease. Circulation. 2010; 121: Z329-J242. Aortic aneurysm NOS (ICD10-I71.9) 5. Atherosclerotic change in the thoracic aorta. Coronary artery calcifications. 6. Cholelithiasis. 7. Left adrenal adenoma. Aortic Atherosclerosis (ICD10-I70.0). Electronically Signed   By: Dorise Bullion III M.D   On: 06/13/2019 14:14    EKG: Independently reviewed.  Sinus rhythm.  Old RBBB.  QTc 509.  Assessment/Plan Active Problems:   Dysphagia   Aortic aneurysm (HCC)  Dysphagia-CT shows fluid and debris in esophagus. Etiology not seen.  Possible distal mass or stricture.  Possible achalasia.  History of GERD. GI consulted by EDP, Dr. Laural Golden to see. -N.p.o. - D5 1/2 Ns 75cc/hr - BMP, CBC a.m  Aspiration pneumonia- productive cough, leukocytosis 17, O2 sats 88% on room air.  Denies Dyspnea.  Chest CT suggests aspiration, pneumonia.  Rules out for sepsis. - CBC a.m. -Patient would benefit from antibiotics, I talked to daughter she is not sure of the details of since allergies but she confirms that patient is allergic to penicillins, cephalosporins and morphine. But her allergy to fish is difficulty breathing. -QTc is prolonged, considering limited choices will treat with IV clindamycin 100 mg Q8H.  Prolonged  QTC-507.  On  Seroquel. - Hold Seroquel while n.p.o. -Check magnesium, EKG a.m., telemetry monitoring  Ascending thoracic aorta aneurysm- measuring 4.3 cm.  Incidental finding on chest CT.  Annual imaging follow-up by CTA or MRA recommended. - Follow up as outpatient - Problem List updated  History of atrial fibrillation-  currently in sinus rhythm. Not on anticoagulation.   CHF history-likely diastolic.  Appears stable.  Last echo on file 2016 EF 50 to 55%.  On Lasix 40 mg twice a week. -Lasix while n.p.o.  HTN-systolic 683M to 196Q.  Home medications carvedilol 12.5 mg twice daily. -PRN labetalol   DVT prophylaxis: SCDS Code Status: DNR- Confirmed with patient's daughter on the phone. Family Communication: Talked to patient's daughter Ms. Harrell, on the phone, about plan of care, and CODE STATUS.  All questions answered. Disposition Plan: Per rounding team Consults called: Gastroenterology Admission status: Obs, Med-surg   Bethena Roys MD [facility]  06/13/2019, 3:50 PM

## 2019-06-13 NOTE — ED Notes (Signed)
From CT 

## 2019-06-13 NOTE — ED Notes (Signed)
JI at bedside  

## 2019-06-14 DIAGNOSIS — K219 Gastro-esophageal reflux disease without esophagitis: Secondary | ICD-10-CM | POA: Diagnosis not present

## 2019-06-14 DIAGNOSIS — R1310 Dysphagia, unspecified: Secondary | ICD-10-CM | POA: Diagnosis not present

## 2019-06-14 DIAGNOSIS — J690 Pneumonitis due to inhalation of food and vomit: Principal | ICD-10-CM

## 2019-06-14 DIAGNOSIS — I2510 Atherosclerotic heart disease of native coronary artery without angina pectoris: Secondary | ICD-10-CM | POA: Diagnosis not present

## 2019-06-14 DIAGNOSIS — I25810 Atherosclerosis of coronary artery bypass graft(s) without angina pectoris: Secondary | ICD-10-CM

## 2019-06-14 LAB — CBC
HCT: 31.9 % — ABNORMAL LOW (ref 36.0–46.0)
Hemoglobin: 9.5 g/dL — ABNORMAL LOW (ref 12.0–15.0)
MCH: 26.5 pg (ref 26.0–34.0)
MCHC: 29.8 g/dL — ABNORMAL LOW (ref 30.0–36.0)
MCV: 89.1 fL (ref 80.0–100.0)
Platelets: 305 10*3/uL (ref 150–400)
RBC: 3.58 MIL/uL — ABNORMAL LOW (ref 3.87–5.11)
RDW: 14.1 % (ref 11.5–15.5)
WBC: 13.2 10*3/uL — ABNORMAL HIGH (ref 4.0–10.5)
nRBC: 0 % (ref 0.0–0.2)

## 2019-06-14 LAB — BASIC METABOLIC PANEL
Anion gap: 10 (ref 5–15)
BUN: 27 mg/dL — ABNORMAL HIGH (ref 8–23)
CO2: 25 mmol/L (ref 22–32)
Calcium: 9.4 mg/dL (ref 8.9–10.3)
Chloride: 108 mmol/L (ref 98–111)
Creatinine, Ser: 1.21 mg/dL — ABNORMAL HIGH (ref 0.44–1.00)
GFR calc Af Amer: 44 mL/min — ABNORMAL LOW (ref >60)
GFR calc non Af Amer: 38 mL/min — ABNORMAL LOW (ref >60)
Glucose, Bld: 137 mg/dL — ABNORMAL HIGH (ref 70–99)
Potassium: 3.5 mmol/L (ref 3.5–5.1)
Sodium: 143 mmol/L (ref 135–145)

## 2019-06-14 LAB — NOVEL CORONAVIRUS, NAA (HOSP ORDER, SEND-OUT TO REF LAB; TAT 18-24 HRS): SARS-CoV-2, NAA: NOT DETECTED

## 2019-06-14 MED ORDER — PANTOPRAZOLE SODIUM 40 MG IV SOLR
40.0000 mg | INTRAVENOUS | Status: DC
  Administered 2019-06-14: 40 mg via INTRAVENOUS
  Filled 2019-06-14: qty 40

## 2019-06-14 MED ORDER — ALPRAZOLAM 0.5 MG PO TABS
0.5000 mg | ORAL_TABLET | Freq: Two times a day (BID) | ORAL | Status: DC | PRN
  Administered 2019-06-14: 0.5 mg via ORAL
  Filled 2019-06-14: qty 1

## 2019-06-14 MED ORDER — ALBUTEROL SULFATE (2.5 MG/3ML) 0.083% IN NEBU: 2.5000 mg | INHALATION_SOLUTION | RESPIRATORY_TRACT | Status: DC | PRN

## 2019-06-14 MED ORDER — METOPROLOL TARTRATE 5 MG/5ML IV SOLN
5.0000 mg | Freq: Three times a day (TID) | INTRAVENOUS | Status: DC
  Administered 2019-06-14: 5 mg via INTRAVENOUS
  Filled 2019-06-14: qty 5

## 2019-06-14 MED ORDER — HYDRALAZINE HCL 20 MG/ML IJ SOLN: 10.0000 mg | Freq: Four times a day (QID) | INTRAMUSCULAR | Status: DC | PRN

## 2019-07-01 NOTE — Progress Notes (Signed)
Upon giving an anti-anxiety medication, pt became more hypoxic and was struggling to breathe as she was swallowing the medication and water. Non-re-breather oxygen applied, and pt was still hypoxic. RR team called, by then patient was asystole on tele and oxygen sat's were in the 20's. Pt was grey and un-responsive. MD arrived to the room, and declared pt deceased at 68, attending RN verified. Phone call to daughter Bethena Roys was made. Planning to come up to floor. Call made to CDS, not a potential donor. Awaiting family to visit then post-mortem care will be performed and other arrangements to be made.

## 2019-07-01 NOTE — Progress Notes (Addendum)
Patient Demographics:    [name], is a 85 y.o. female, DOB - [birth year], TKP:[id number]  Admit date - 06/13/2019   Admitting Physician Bethena Roys, MD  Outpatient Primary MD for the patient is Jake Samples, PA-C  LOS - 0   Chief Complaint  Patient presents with   Swallowed Foreign Body   Facial Swelling        Subjective:    [name] today has no fevers, no emesis,  No chest pain, resting comfortably, sounded congested/gurgling, RN [place] attempted oral suctioning without significant secretions retrieved  Assessment  & Plan :    Principal Problem:   Aspiration pneumonia ([place]) Active Problems:   Dysphagia   Hypertension   GERD (gastroesophageal reflux disease)   CAD (coronary artery disease)   Atrial fibrillation (HCC)   S/P AVR (aortic valve replacement)   Aortic aneurysm [facility])  Brief Summary 85 year old female with past medical history relevant for CAD/CHF, CKD III, history of aortic stenosis and prior aortic valve replacement, history of GERD who presents to the ED with a 2-day history of swallowing problems and dysphagia, as well as cough --- on she was found to have leukocytosis and chest CT revealed fluid-filled esophagus also containing some debris all the way down to GE junction.  No definite mass was noted.  She was also noted to have moderate size sliding hiatal hernia bibasilar plan pulmonary infiltrates as well as lingula infiltrate.  Patient was felt to have aspiration pneumonia-admitted 17-Jun-2019 for further GI work-up and treatment of aspiration pneumonia  A/p  1)Aspiration Pneumonia--- hypoxia persist, requiring 5 L of oxygen, sounds pretty congested/gurgly,  Pt with history of penicillin allergy, no history of recent exposure to cephalosporins antibiotic choices limited, reluctantly treat with IV clindamycin--- WBC is down to 13 from 17, oral  suction as needed, discussed with GI physician Dr. Laural Golden, plan for EGD possibly on 06/15/2019  2)Dysphagia--- esophagus with debris,  clinical exam and imaging studies not consistent with acute stroke, may need speech and swallow eval pending EGD evaluation results  3)CKD III--- baseline creatinine usually between 1.6 and 1.7, on admission creatinine was 1.63, with hydration creatinine is down to 1.21,  , renally adjust medications, avoid nephrotoxic agents/dehydration/hypotension  4)Ascending thoracic aorta aneurysm -----incidental finding, measures 4.3 cm, and will follow up on imaging as outpatient  5)HFpEF--- no acute exacerbation of chronic diastolic dysfunction CHF at this time , last known EF 50 to 55% in 2016, stable, PTA was on Lasix orally twice a week, hold Lasix for now due to risk of dehydration with n.p.o. status  6)PAFiB--currently sinus rhythm, PTA was not on anticoagulation,, was on Coreg for rate control presumably... Okay to use IV metoprolol with parameters while n.p.o.   Disposition/Need for in-Hospital Stay- patient unable to be discharged at this time due to aspiration pneumonia requiring IV antibiotics, dysphagia requiring further work-up including EGD-EGD planned for 06/15/2019... Patient needs IV fluids while n.p.o. given underlying CKD 3 risk for dehydration and worsening renal function  Code Status : DNR  Family Communication:   Daughter... Ms Skeet Latch   Disposition Plan  : TBD  Consults  :  Gi  DVT Prophylaxis  :  - SCDs  Lab Results  Component Value Date  PLT 305 26-Jun-2019    Inpatient Medications  Scheduled Meds:  pantoprazole (PROTONIX) IV  40 mg Intravenous Q24H   Continuous Infusions:  clindamycin (CLEOCIN) IV 600 mg (2019/06/26 1309)   dextrose 5 % and 0.45% NaCl 75 mL/hr at 2019-06-26 0322   PRN Meds:.ALPRAZolam, labetalol, promethazine    Anti-infectives (From admission, onward)   Start     Dose/Rate Route Frequency Ordered Stop    06/13/19 1630  clindamycin (CLEOCIN) IVPB 600 mg     600 mg 100 mL/hr over 30 Minutes Intravenous Every 8 hours 06/13/19 1623          Objective:   Vitals:   06-26-19 0643 06/26/2019 0650 26-Jun-2019 0924 06/26/19 1459  BP: (!) 150/73   (!) 154/80  Pulse: 75 76  78  Resp: 17   19  Temp: 97.6 F (36.4 C)   (!) 97.5 F (36.4 C)  TempSrc: Oral   Axillary  SpO2: (!) 86% 94% 94% 90%  Weight:      Height:        Wt Readings from Last 3 Encounters:  06/13/19 56.7 kg  01/12/19 56.7 kg  12/08/18 56.7 kg     Intake/Output Summary (Last 24 hours) at 2019-06-26 1657 Last data filed at 06-26-2019 0700 Gross per 24 hour  Intake 733.5 ml  Output 400 ml  Net 333.5 ml     Physical Exam   Gen:- Awake Alert,  In no apparent distress  HEENT:- [place].AT, No sclera icterus, cleft palate Nose- [place] 5L/min Ears--- very, very hard of hearing Neck-Supple Neck,No JVD,.  Lungs-sounds congested/gurgly, diminished in bases, no wheezing  CV- S1, S2 normal, regular  Abd-  +ve B.Sounds, Abd Soft, No tenderness,    Extremity/Skin:- No  edema, pedal pulses present  Psych-affect is appropriate, oriented  Neuro-generalized weakness with no new focal deficits no new focal deficits, no tremors   Data Review:   Micro Results No results found for this or any previous visit (from the past 240 hour(s)).  Radiology Reports Ct Head Wo Contrast  Result Date: 06/13/2019 CLINICAL DATA:  Focal neuro deficit.  Suspected stroke. EXAM: CT HEAD WITHOUT CONTRAST TECHNIQUE: Contiguous axial images were obtained from the base of the skull through the vertex without intravenous contrast. COMPARISON:  October 23, 2018 FINDINGS: Brain: No subdural, epidural, or subarachnoid hemorrhage. Ventricles and sulci are prominent but stable. Severe white matter changes are noted. Cerebellum, brainstem, and basal cisterns are normal. No acute cortical ischemia or infarct. No mass effect or midline shift. Vascular: Calcified  atherosclerosis in the intracranial carotids. Skull: Normal. Negative for fracture or focal lesion. Sinuses/Orbits: Paranasal sinuses, right mastoid air cells, and middle ears are normal. There is scattered opacification of left mastoid air cells without associated soft tissue swelling or bony erosion. Other: None. IMPRESSION: Chronic white matter changes. No acute intracranial abnormalities identified. No acute stroke or bleed noted. Electronically Signed   By: Dorise Bullion III M.D   On: 06/13/2019 13:59   Ct Chest Wo Contrast  Result Date: 06/13/2019 CLINICAL DATA:  New onset dysphagia. Assess for thoracic mass or esophageal pathology. EXAM: CT CHEST WITHOUT CONTRAST TECHNIQUE: Multidetector CT imaging of the chest was performed following the standard protocol without IV contrast. COMPARISON:  None. FINDINGS: Cardiovascular: Cardiomegaly. The patient is status post aortic valve replacement. The ascending thoracic aorta measures 4.3 cm proximally. Atherosclerosis seen in the thoracic aorta. The main pulmonary artery is normal in caliber. Coronary artery calcifications are identified. Mediastinum/Nodes: There is a moderate  hiatal hernia. The esophagus is dilated with fluid and debris. The fluid and debris extends into the proximal esophagus. An underlying cause is not identified. The thyroid is normal. No adenopathy. No effusions. Lungs/Pleura: Central airways are normal. No pneumothorax. Infiltrate is seen in the bilateral lung bases, right greater than left, worrisome for pneumonia or aspiration. Given the history, aspiration should be considered. Mild opacity in the lingula identified as well. No suspicious nodules or masses. Upper Abdomen: Cholelithiasis.  Adenoma in the left adrenal gland. Musculoskeletal: No chest wall mass or suspicious bone lesions identified. IMPRESSION: 1. The esophagus is filled with fluid and debris extending from the GE junction into the proximal esophagus. An underlying cause is  not seen. A distal mass or stricture could explain these findings. Other etiologies such as achalasia are possible although I would expect more dilatation of the esophagus in the setting of achalasia. Recommend consultation with gastroenterology. It is unlikely the patient could have a barium study for further evaluation given the amount of debris and fluid in the esophagus. 2. Moderate hiatal hernia. 3. Bibasilar pulmonary infiltrates. Mild infiltrate in the lingula. Given the debris in the esophagus, I would favor aspiration. Pneumonia is possible. 4. Mild aneurysmal dilatation of the ascending thoracic aorta measuring up to 4.3 cm. Recommend annual imaging followup by CTA or MRA. This recommendation follows 2010 ACCF/AHA/AATS/ACR/ASA/SCA/SCAI/SIR/STS/SVM Guidelines for the Diagnosis and Management of Patients with Thoracic Aortic Disease. Circulation. 2010; 121: M629-U765. Aortic aneurysm NOS (ICD10-I71.9) 5. Atherosclerotic change in the thoracic aorta. Coronary artery calcifications. 6. Cholelithiasis. 7. Left adrenal adenoma. Aortic Atherosclerosis (ICD10-I70.0). Electronically Signed   By: Dorise Bullion III M.D   On: 06/13/2019 14:14     CBC Recent Labs  Lab 06/13/19 1227 07/13/19 0606  WBC 17.3* 13.2*  HGB 10.1* 9.5*  HCT 33.1* 31.9*  PLT 331 305  MCV 89.5 89.1  MCH 27.3 26.5  MCHC 30.5 29.8*  RDW 14.2 14.1  LYMPHSABS 0.9  --   MONOABS 0.5  --   EOSABS 0.1  --   BASOSABS 0.0  --     Chemistries  Recent Labs  Lab 06/13/19 1227 06/13/19 1537 2019-07-13 0606  NA 145  --  143  K 4.2  --  3.5  CL 108  --  108  CO2 22  --  25  GLUCOSE 128*  --  137*  BUN 34*  --  27*  CREATININE 1.63*  --  1.21*  CALCIUM 10.1  --  9.4  MG  --  2.4  --    ------------------------------------------------------------------------------------------------------------------ No results for input(s): CHOL, HDL, LDLCALC, TRIG, CHOLHDL, LDLDIRECT in the last 72 hours.  No results found for:  HGBA1C ------------------------------------------------------------------------------------------------------------------ No results for input(s): TSH, T4TOTAL, T3FREE, THYROIDAB in the last 72 hours.  Invalid input(s): FREET3 ------------------------------------------------------------------------------------------------------------------ No results for input(s): VITAMINB12, FOLATE, FERRITIN, TIBC, IRON, RETICCTPCT in the last 72 hours.  Coagulation profile No results for input(s): INR, PROTIME in the last 168 hours.  No results for input(s): DDIMER in the last 72 hours.  Cardiac Enzymes No results for input(s): CKMB, TROPONINI, MYOGLOBIN in the last 168 hours.  Invalid input(s): CK ------------------------------------------------------------------------------------------------------------------ No results found for: BNP   Roxan Hockey M.D on 2019-07-13 at 4:57 PM  Go to [email or web address] - for contact info  [facility] - Office  [phone]

## 2019-07-01 NOTE — Progress Notes (Signed)
Performed oral suction per Dr. Olevia Perches recommendations. Pt tolerated well, no oral secretions were obtained. Suction remains at bedside in case it is needed. Oral care performed. Will continue to monitor.

## 2019-07-01 NOTE — Progress Notes (Signed)
   NB-- I responded to rapid response call, alerted by RN to patient's bedside due to unresponsive episode--- upon arrival patient was pulseless and apneic, made a phone call to patient's daughter Ms Skeet Latch to verify [place].... Patient does not verify the patient is a DNR, RN also verify patient CODE STATUS with daughter over the phone. --sadly patient expired on 20-Jun-2019 at 1744 PM...   Earlier in the day ..the patient had hypoxia  requiring 5 L of oxygen, sounded pretty congested/gurgly,    Roxan Hockey, MD

## 2019-07-01 NOTE — Consult Note (Signed)
Referring Provider: Roxan Hockey, MD Primary Care Physician:  Scherrie Bateman Primary Gastroenterologist:  Dr. Laural Golden  Reason for Consultation:    Dilated esophagus with food debris chest CT  HPI:   Patient is 85 year old Caucasian female with history of GERD who was noted to have swallowing difficulty by her caregiver 2 days ago.  As she was not able to drink liquids or eat she was brought to emergency room yesterday morning.  On evaluation in the emergency room there was concern for acute neurologic event.  Unenhanced head CT did not show any acute changes.  She then went on to have chest CT which revealed fluid-filled esophagus also containing some debris all the way down to GE junction.  No definite mass was noted.  She was also noted to have moderate size sliding hiatal hernia bibasilar plan pulmonary infiltrates as well as lingula infiltrate.  Patient was felt to have aspiration pneumonia.  She also had thoracic aortic aneurysm measuring 4.3 cm. Patient was hospitalized.  She was made n.p.o.  She was begun on clindamycin IV since she is pen allergic.   Patient has no complaints.  She denies nausea vomiting heartburn chest pain or shortness of breath.  According to her daughter Bethena Roys she has not had any swallowing difficulty until 2 days ago.  She recalls that she ate very well night before her symptoms started. Patient lives at home.  She has round-the-clock sitters. Patient's daughter states that her mother was doing well until she fell in October last year and broke her hip.  From then on it has been downhill and she has not been doing well.   Past Medical History:  Diagnosis Date  . Anemia   . Aortic stenosis   . CAD (coronary artery disease)   . CHF (congestive heart failure) ([place])   . Cleft palate   . Ehlers-Danlos syndrome    per daughter, never officailly diagnosed but shows signs. Family history  . GERD (gastroesophageal reflux disease)   . Hypertension      Past Surgical History:  Procedure Laterality Date  . ABDOMINAL HYSTERECTOMY    . AORTIC VALVE REPLACEMENT  08/10/11  . CARDIAC SURGERY    . CATARACT EXTRACTION    . CORONARY ARTERY BYPASS GRAFT  08/19/22  . CYSTOSTOMY    . HERNIA REPAIR    . TONSILLECTOMY      Prior to Admission medications   Medication Sig Start Date End Date Taking? Authorizing Provider  acetaminophen (TYLENOL) 325 MG tablet Take 650 mg by mouth every 6 (six) hours as needed. For pain    Yes [provider]  aspirin EC 81 MG tablet Take 1 tablet (81 mg total) by mouth daily. 10/10/18  Yes Barrett, Evelene Croon, PA-C  carvedilol (COREG) 12.5 MG tablet TAKE 1 TABLET BY MOUTH TWICE DAILY Patient taking differently: Take 12.5 mg by mouth 2 (two) times daily with a meal.  08/07/18  Yes Fay Records, MD  cetirizine (ZYRTEC) 10 MG tablet Take 10 mg by mouth every morning.    Yes [provider]  docusate sodium (COLACE) 100 MG capsule Take 100 mg by mouth 3 (three) times daily.    Yes [provider]  furosemide (LASIX) 40 MG tablet TAKE 1 TABLET(40 MG TOTAL) BY MOUTH ON Monday AND Thursday WITH POTASSIUM. 10/24/18  Yes Johnson, Clanford L, MD  omeprazole (PRILOSEC) 20 MG capsule take 1 capsule by mouth once daily Patient taking differently: Take 20 mg by mouth every  morning.  10/22/17  Yes Setzer, Terri L, NP  potassium chloride SA (K-DUR,KLOR-CON) 20 MEQ tablet Take 1 tablet (20 mEq total) by mouth 2 (two) times a week. Take 2 Times Weekly on Monday and Thursday. Take an extra Lasix and potassium tablet for weight gain of 3 lbs in a day or 5 lbs in a week 10/27/18  Yes Johnson, Clanford L, MD  QUEtiapine (SEROQUEL) 50 MG tablet Take 50 mg by mouth daily.  05/28/19  Yes [provider]  rosuvastatin (CRESTOR) 10 MG tablet TAKE 1 TABLET BY MOUTH ONCE DAILY Patient taking differently: Take 10 mg by mouth every evening.  10/08/18  Yes Imogene Burn, PA-C  traMADol (ULTRAM) 50 MG tablet Take 1  tablet (50 mg total) by mouth every 12 (twelve) hours as needed. Patient taking differently: Take 50 mg by mouth every 12 (twelve) hours as needed for moderate pain.  10/18/18  Yes Davonna Belling, MD    Current Facility-Administered Medications  Medication Dose Route Frequency Provider Last Rate Last Dose  . clindamycin (CLEOCIN) IVPB 600 mg  600 mg Intravenous Q8H Emokpae, Ejiroghene E, MD 100 mL/hr at June 24, 2019 0456 600 mg at 24-Jun-2019 0456  . dextrose 5 %-0.45 % sodium chloride infusion   Intravenous Continuous Emokpae, Ejiroghene E, MD 75 mL/hr at 06/24/2019 0322    . labetalol (NORMODYNE) injection 10 mg  10 mg Intravenous Q2H PRN Emokpae, Ejiroghene E, MD   10 mg at 06/13/19 1821  . promethazine (PHENERGAN) tablet 12.5 mg  12.5 mg Oral Q6H PRN Emokpae, Ejiroghene E, MD        Allergies as of 06/13/2019 - Review Complete 06/13/2019  Allergen Reaction Noted  . Fish allergy Shortness Of Breath and Swelling 08/27/2011  . Lipitor [atorvastatin calcium] Rash 10/09/2011  . Cheese Other (See Comments) 08/27/2011  . Morphine and related Nausea And Vomiting 07/28/2011  . Penicillins Other (See Comments) 07/28/2011  . Sulfa antibiotics Other (See Comments) 07/28/2011    Family History  Problem Relation Age of Onset  . Heart failure Father   . Heart attack Father   . Ehlers-Danlos syndrome Father     Social History   Socioeconomic History  . Marital status: Widowed    Spouse name: Not on file  . Number of children: Not on file  . Years of education: Not on file  . Highest education level: Not on file  Occupational History  . Not on file  Social Needs  . Financial resource strain: Not on file  . Food insecurity    Worry: Not on file    Inability: Not on file  . Transportation needs    Medical: Not on file    Non-medical: Not on file  Tobacco Use  . Smoking status: Never Smoker  . Smokeless tobacco: Never Used  Substance and Sexual Activity  . Alcohol use: No  . Drug use:  No  . Sexual activity: Never  Lifestyle  . Physical activity    Days per week: Not on file    Minutes per session: Not on file  . Stress: Not on file  Relationships  . Social Herbalist on phone: Not on file    Gets together: Not on file    Attends religious service: Not on file    Active member of club or organization: Not on file    Attends meetings of clubs or organizations: Not on file    Relationship status: Not on file  . Intimate partner  violence    Fear of current or ex partner: Not on file    Emotionally abused: Not on file    Physically abused: Not on file    Forced sexual activity: Not on file  Other Topics Concern  . Not on file  Social History Narrative  . Not on file    Review of Systems: See HPI, otherwise normal ROS  Physical Exam: Temp:  [97.6 F (36.4 C)-98.7 F (37.1 C)] 97.6 F (36.4 C) (06/14 0643) Pulse Rate:  [75-91] 76 (06/14 0650) Resp:  [16-18] 17 (06/14 0643) BP: (150-187)/(73-97) 150/73 (06/14 0643) SpO2:  [86 %-98 %] 94 % (06/14 0924) Weight:  [56.7 kg] 56.7 kg (06/13 1146)   Patient is alert and in no acute distress. She has severe hearing impairment. Her speech is abnormal most likely due to partial cleft palate. Conjunctivae is pink.  Sclerae nonicteric. No neck masses or thyromegaly noted. She has midsternal scar. Cardiac exam with regular rhythm normal S1 and S2.  No murmur or gallop noted. Auscultation of lungs reveal vesicular breath sounds with crackles at both bases. Abdomen is symmetrical bowel sounds are normal.  On palpation abdomen is soft and nontender.  Liver edge is easily palpable.  Soft and nontender.  No organomegaly or masses. Extremities are thin but no clubbing or edema noted.  Lab Results: Recent Labs    06/13/19 1227 07/06/2019 0606  WBC 17.3* 13.2*  HGB 10.1* 9.5*  HCT 33.1* 31.9*  PLT 331 305   BMET Recent Labs    06/13/19 1227 July 06, 2019 0606  NA 145 143  K 4.2 3.5  CL 108 108  CO2 22 25   GLUCOSE 128* 137*  BUN 34* 27*  CREATININE 1.63* 1.21*  CALCIUM 10.1 9.4    Studies/Results: Ct Head Wo Contrast  Result Date: 06/13/2019 CLINICAL DATA:  Focal neuro deficit.  Suspected stroke. EXAM: CT HEAD WITHOUT CONTRAST TECHNIQUE: Contiguous axial images were obtained from the base of the skull through the vertex without intravenous contrast. COMPARISON:  October 23, 2018 FINDINGS: Brain: No subdural, epidural, or subarachnoid hemorrhage. Ventricles and sulci are prominent but stable. Severe white matter changes are noted. Cerebellum, brainstem, and basal cisterns are normal. No acute cortical ischemia or infarct. No mass effect or midline shift. Vascular: Calcified atherosclerosis in the intracranial carotids. Skull: Normal. Negative for fracture or focal lesion. Sinuses/Orbits: Paranasal sinuses, right mastoid air cells, and middle ears are normal. There is scattered opacification of left mastoid air cells without associated soft tissue swelling or bony erosion. Other: None. IMPRESSION: Chronic white matter changes. No acute intracranial abnormalities identified. No acute stroke or bleed noted. Electronically Signed   By: Dorise Bullion III M.D   On: 06/13/2019 13:59   Ct Chest Wo Contrast  Result Date: 06/13/2019 CLINICAL DATA:  New onset dysphagia. Assess for thoracic mass or esophageal pathology. EXAM: CT CHEST WITHOUT CONTRAST TECHNIQUE: Multidetector CT imaging of the chest was performed following the standard protocol without IV contrast. COMPARISON:  None. FINDINGS: Cardiovascular: Cardiomegaly. The patient is status post aortic valve replacement. The ascending thoracic aorta measures 4.3 cm proximally. Atherosclerosis seen in the thoracic aorta. The main pulmonary artery is normal in caliber. Coronary artery calcifications are identified. Mediastinum/Nodes: There is a moderate hiatal hernia. The esophagus is dilated with fluid and debris. The fluid and debris extends into the proximal  esophagus. An underlying cause is not identified. The thyroid is normal. No adenopathy. No effusions. Lungs/Pleura: Central airways are normal. No pneumothorax. Infiltrate is  seen in the bilateral lung bases, right greater than left, worrisome for pneumonia or aspiration. Given the history, aspiration should be considered. Mild opacity in the lingula identified as well. No suspicious nodules or masses. Upper Abdomen: Cholelithiasis.  Adenoma in the left adrenal gland. Musculoskeletal: No chest wall mass or suspicious bone lesions identified. IMPRESSION: 1. The esophagus is filled with fluid and debris extending from the GE junction into the proximal esophagus. An underlying cause is not seen. A distal mass or stricture could explain these findings. Other etiologies such as achalasia are possible although I would expect more dilatation of the esophagus in the setting of achalasia. Recommend consultation with gastroenterology. It is unlikely the patient could have a barium study for further evaluation given the amount of debris and fluid in the esophagus. 2. Moderate hiatal hernia. 3. Bibasilar pulmonary infiltrates. Mild infiltrate in the lingula. Given the debris in the esophagus, I would favor aspiration. Pneumonia is possible. 4. Mild aneurysmal dilatation of the ascending thoracic aorta measuring up to 4.3 cm. Recommend annual imaging followup by CTA or MRA. This recommendation follows 2010 ACCF/AHA/AATS/ACR/ASA/SCA/SCAI/SIR/STS/SVM Guidelines for the Diagnosis and Management of Patients with Thoracic Aortic Disease. Circulation. 2010; 121: J628-B151. Aortic aneurysm NOS (ICD10-I71.9) 5. Atherosclerotic change in the thoracic aorta. Coronary artery calcifications. 6. Cholelithiasis. 7. Left adrenal adenoma. Aortic Atherosclerosis (ICD10-I70.0). Electronically Signed   By: Dorise Bullion III M.D   On: 06/13/2019 14:14   I have reviewed patient's chest CT.  Assessment;  Patient is elderly Caucasian female  who presents with dysphagia of recent onset.  Chest CT reveals mildly dilated esophagus with food debris.  No definite mass noted in the cardia.  She has chronic GERD.  She also has bilateral infiltrate felt to be due to aspiration pneumonia.  He is on IV clindamycin since she has pan allergy.   Differential diagnosis includes achalasia benign or malignant distal esophageal stricture.  Anemia.  No history of GI bleed.  Suspect anemia of chronic disease.  Will check stool to make sure she does not have occult GI bleed.  Azotemia.  BUN and creatinine have improved with IV hydration.  Renal dysfunction would appear to be due to dehydration.    Recommendations;  Stool Hemoccult x1. Pantoprazole 40 mg IV every 24 hours. Esophagogastroduodenoscopy with esophageal dilation if indicated to be performed on 06/15/2019. Patient is agreeable.  I also contacted patient's daughter Ms. Skeet Latch over the phone and she is also agreeable.   LOS: 0 days   Najeeb Rehman  2019-07-01, 10:18 AM

## 2019-07-01 NOTE — Discharge Summary (Addendum)
Roberta GUADALUPE [name]:[id number] DOB: 1922-02-07 DOA: June 18, 2019  PCP: Jake Samples, PA-C PCP/Office notified:  Admit date: 06/18/2019 Date of Death: 2019/06/19 --sadly patient expired on 2019-06-19 at 1744 PM...   Final Diagnoses:  Principal Problem:   Aspiration pneumonia ([place]) Active Problems:   Dysphagia   Hypertension   GERD (gastroesophageal reflux disease)   CAD (coronary artery disease)   Atrial fibrillation ([facility])   S/P AVR (aortic valve replacement)   Aortic aneurysm ([facility])   NB-- I responded to rapid response call, alerted by RN to patient's bedside due to unresponsive episode--- upon arrival patient was pulseless and apneic, made a phone call to patient's daughter [name] to verify [place].... Patient does not verify the patient is a DNR, RN also verify patient CODE STATUS with daughter over the phone. --sadly patient expired on Jun 19, 2019 at 1744 PM...   History of present illness:  Chief Complaint: Difficulty swallowing  HPI: [name] is a 85 y.o. female with medical history significant for hypertension, CHF, coronary artery disease, aortic stenosis, CKD 4 atrial fibrillation, aortic valve replacement was brought to the ED with complaints of difficulty swallowing over the past 2 days.  Patient denies pain with swallowing.  She also reports a cough productive of phlegm.  She denies difficulty breathing.  Patient is able to answer most of my questions but she is hard of hearing and because she has cleft palate it is at times difficult to discern exactly what she is saying.  Daughter denies fevers at home.  ED Course: Blood pressure Systolic 741U to 384T, O2 sats 88% on room air, increased to greater than 91% with 2 L nasal cannula.  WBC 17.3, worsening about baseline at 1.63.  CT head no acute intracranial abnormalities.  Chest CT without contrast-esophagus filled with fluid and debris extending from the gastroesophageal junction into the proximal  esophagus.  Underlying cause not seen.  Distal muscle stricture could explain findings. ( Please see detailed report).  Possible achalasia, .  GI consult recommended.  Bibasilar pulmonary infiltrates aspiration favored.  Pneumonia possible. EDP asked to Dr. Melony Overly on call for gastroenterologist plan for possible EGD tomorrow.  Hospital Course:   Brief Summary 85 year old female with past medical history relevant for CAD/CHF, CKD III, history of aortic stenosis and prior aortic valve replacement, history of GERD who presents to the ED with a 2-day history of swallowing problems and dysphagia, as well as cough --- on she was found to have leukocytosis and chest CT revealed fluid-filled esophagus also containing some debris all the way down to GE junction. No definite mass was noted. She was also noted to have moderate size sliding hiatal hernia bibasilar plan pulmonary infiltrates as well as lingula infiltrate. Patient was felt to have aspiration pneumonia-admitted 06/19/2019 for further GI work-up and treatment of aspiration pneumonia---- sadly patient expired on 06/19/2019 at 1744 PM...    A/p  1)Aspiration Pneumonia---  hypoxia persist, requiring 5 L of oxygen, sounds pretty congested/gurgly,  Pt with history of penicillin allergy, no history of recent exposure to cephalosporins antibiotic choices limited, reluctantly treated with IV clindamycin--- WBC is down to 13 from 17, oral suction as needed, discussed with GI physician Dr. Laural Golden, plan for EGD possibly on 06/15/2019----- ----sadly patient expired on Jun 19, 2019 at 1744 PM...  ---DNR CODE STATUS confirmed verbally with daughter on phone by myself and RN at bedside as patient became apneic and bradycardia down   2)Dysphagia--- esophagus with debris,  clinical exam and imaging  studies not consistent with acute stroke, may need speech and swallow eval pending EGD evaluation results -sadly patient expired on 06/27/2019 at [place] PM..Marland Kitchen   3)CKD  III--- baseline creatinine usually between 1.6 and 1.7, on admission creatinine was 1.63, with hydration creatinine is down to 1.21,  , renally adjust medications, avoid nephrotoxic agents/dehydration/hypotension -sadly patient expired on 06-27-19 at 1744 PM..Marland Kitchen  4)Ascending thoracic aorta aneurysm -----incidental finding, measures 4.3 cm, and will follow up on imaging as outpatient --sadly patient expired on 06/27/19 at 1744 PM..Marland Kitchen   5)HFpEF--- no acute exacerbation of chronic diastolic dysfunction CHF at this time , last known EF 50 to 55% in 2016, stable, PTA was on Lasix orally twice a week, hold Lasix for now due to risk of dehydration with n.p.o. status ---sadly patient expired on 06-27-2019 at [place] PM..Marland Kitchen  6)PAFiB--currently sinus rhythm, PTA was not on anticoagulation,, was on Coreg for rate control presumably... Okay to use IV metoprolol with parameters while n.p.o. ---sadly patient expired on 27-Jun-2019 at 1744 PM...   Disposition ---sadly patient expired on 06/27/19 at 1744 PM...   Code Status : DNR  Family Communication:   Daughter... [name]   Consults  :  Fabienne Bruns   Time: Time of death---- 06-27-19 at 1744 PM,  sadly patient expired on 06-27-19 at 1744 PM...  NB-- I responded to rapid response call, alerted by RN to patient's bedside due to unresponsive episode--- upon arrival patient was pulseless and apneic, made a phone call to patient's daughter [name] to verify [place].... Patient does not verify the patient is a DNR, RN also verify patient CODE STATUS with daughter over the phone. --sadly patient expired on 27-Jun-2019 at 1744 PM...   Signed:  Roxan Hockey  [facility] June 27, 2019, 6:03 PM

## 2019-07-01 DEATH — deceased
